# Patient Record
Sex: Female | Born: 1940
Health system: Southern US, Community
[De-identification: ages and names within clinical notes are randomized; demographics above are authoritative.]

## PROBLEM LIST (undated history)

## (undated) DIAGNOSIS — L718 Other rosacea: Secondary | ICD-10-CM

## (undated) DIAGNOSIS — M5412 Radiculopathy, cervical region: Secondary | ICD-10-CM

## (undated) DIAGNOSIS — M159 Polyosteoarthritis, unspecified: Secondary | ICD-10-CM

## (undated) DIAGNOSIS — F419 Anxiety disorder, unspecified: Secondary | ICD-10-CM

## (undated) DIAGNOSIS — I8390 Asymptomatic varicose veins of unspecified lower extremity: Secondary | ICD-10-CM

## (undated) DIAGNOSIS — M5030 Other cervical disc degeneration, unspecified cervical region: Secondary | ICD-10-CM

## (undated) HISTORY — PX: APPENDECTOMY: SHX54

## (undated) HISTORY — PX: BREAST SURGERY: SHX581

## (undated) HISTORY — PX: SHOULDER ARTHROSCOPY DISTAL CLAVICLE EXCISION AND OPEN ROTATOR CUFF REPAIR: SHX2396

## (undated) HISTORY — PX: TONSILLECTOMY: SUR1361

## (undated) HISTORY — DX: Other rosacea: L71.8

## (undated) HISTORY — DX: Asymptomatic varicose veins of unspecified lower extremity: I83.90

## (undated) HISTORY — DX: Other cervical disc degeneration, unspecified cervical region: M50.30

## (undated) HISTORY — PX: CHOLECYSTECTOMY: SHX55

## (undated) HISTORY — PX: KNEE ARTHROPLASTY: SHX992

## (undated) HISTORY — DX: Polyosteoarthritis, unspecified: M15.9

## (undated) HISTORY — DX: Radiculopathy, cervical region: M54.12

---

## 1995-03-06 HISTORY — PX: BACK SURGERY: SHX140

## 2002-02-10 ENCOUNTER — Encounter: Admission: RE | Admit: 2002-02-10 | Discharge: 2002-03-04 | Payer: Self-pay | Admitting: Sports Medicine

## 2003-06-21 ENCOUNTER — Inpatient Hospital Stay (HOSPITAL_COMMUNITY): Admission: RE | Admit: 2003-06-21 | Discharge: 2003-06-24 | Payer: Self-pay | Admitting: Orthopedic Surgery

## 2003-08-05 ENCOUNTER — Encounter: Admission: RE | Admit: 2003-08-05 | Discharge: 2003-10-12 | Payer: Self-pay | Admitting: Orthopedic Surgery

## 2006-12-18 ENCOUNTER — Encounter: Admission: RE | Admit: 2006-12-18 | Discharge: 2007-02-05 | Payer: Self-pay | Admitting: Orthopedic Surgery

## 2007-04-16 ENCOUNTER — Encounter (INDEPENDENT_AMBULATORY_CARE_PROVIDER_SITE_OTHER): Payer: Self-pay | Admitting: Orthopedic Surgery

## 2007-04-16 ENCOUNTER — Ambulatory Visit (HOSPITAL_COMMUNITY): Admission: RE | Admit: 2007-04-16 | Discharge: 2007-04-17 | Payer: Self-pay | Admitting: Orthopedic Surgery

## 2010-07-18 NOTE — Op Note (Signed)
[name], [name]             ACCOUNT NO.:  [id number]   MEDICAL RECORD NO.:  [id number]          PATIENT TYPE:  OIB   LOCATION:  1525                         FACILITY:  [facility]   PHYSICIAN:  John L. Rendall, M.D.  DATE OF BIRTH:  March 12, 1940   DATE OF PROCEDURE:  04/16/2007  DATE OF DISCHARGE:                               OPERATIVE REPORT   PREOPERATIVE DIAGNOSIS:  Quadriceps avulsion from right patella of total  knee replacement.   SURGICAL PROCEDURES:  Reconstruction of patella, right knee, with repair  of quadriceps tendon.   POSTOPERATIVE DIAGNOSIS:  Quadriceps avulsion from right patella of  total knee replacement.   SURGEON:  John L. Rendall, M.D.   ASSISTANTArlys John D. Petrarca, P.A.-C.   ANESTHESIA:  Spinal.   PATHOLOGY:  The patient is approximately five years from total knee  replacement on the right and has developed progressive increasing pain  in the right knee.  She has had recurrent hemorrhagic effusions and she  has a quad extension lag which has developed the last several months.  MRI shows quad tendon avulsion from the superior pole of the patella.   PROCEDURE:  Under spinal anesthesia, the right leg is prepared with  DuraPrep and draped as a sterile field, was wrapped out with the Esmarch  and the tourniquet is used to 350 mm.  Previous surgical skin incision  is excised.  Dissection was carried down through 1-1/2 inch fatty layer  to the quadriceps mechanism and patella.  This is carefully exposed  before incision.  There is felt to be some thinning of the quadriceps  just proximal to the patella but there is no actual palpable defect  externally.  A medial parapatellar deep incision is then made and  indeed, there is avulsion of quad tendon from the superior pole of the  patella.  The patient's LCS patellar component is overhanging and is at  the level of the upper edge of the bone.  To be able to sew the quad  back into bone, it was necessary to  remove the patella.  Once this was  done and the bony surface was freshened, it was felt to be too thin and  friable to punch more drill holes through for another component.  It was  then rasped as smooth as possible.  Several drill holes were placed and  #2 FiberWire was placed into the patella.  The upper edge of the patella  was freshened to give [place] good of bleeding bone surface as possible.  The  quality of the bone there was not the best but it was what we had.  The  quadriceps tendon was then evaluated.  The inferior 5 mm of it was full  of scar tissue.  This was excised to get back to good collagen and a  modified Tajima stitch was used from the patella bone tunnels into the  quad tendon to reattach it to the superior patella.  In this manner two  stitches were placed in the superior portion of the patella.  I was  reluctant to release the most lateral portion of  the quadriceps and  tightened that down as there appeared to be potential worry in my mind  about avascular necrosis of the patella if I took down much more of the  blood supply to the patella.  Once the major stitches were in place,  there did appear good continuity.  The quad mechanism was then repaired  with mattress sutures of #1 Tycron, the medial parapatellar repair was  repaired with #1 Tycron and the subcu with #1 Vicryl and 2-0 Vicryl.  It  should be noted that upon entering the joint to look at the prosthesis,  there was evidence of hemosiderin staining.  A synovectomy of this  hemosiderin stained synovium was resected and sent the lab.  Subtotal  synovectomy having been carried out, femoral component was solidly in  place.  The tibial component appeared to have an excellent bearing there  was no issues about the tibial component and upon the wound closure, the  patient was placed in a knee immobilizer.  The wound was infiltrated  with Marcaine 0.25% with  epinephrine and 4 mg of morphine 20 mL total.  She  returned to recovery  and was released to home when her spinal wore off.  She will be given  meds that include Celebrex, Lyrica and OxyContin and return to the  office at the first of next week.      John L. Rendall, M.D.  Electronically Signed     JLR/MEDQ  D:  04/16/2007  T:  04/17/2007  Job:  [id number]

## 2010-07-21 NOTE — Op Note (Signed)
NAMEJACKILYN, [name]                         ACCOUNT NO.:  [id number]   MEDICAL RECORD NO.:  [id number]                   PATIENT TYPE:  INP   LOCATION:  2550                                 FACILITY:  [facility]   PHYSICIAN:  John L. Rendall III, M.D.           DATE OF BIRTH:  05/20/1940   DATE OF PROCEDURE:  06/21/2003  DATE OF DISCHARGE:                                 OPERATIVE REPORT   PREOPERATIVE DIAGNOSIS:  Osteoarthritis, right knee.   POSTOPERATIVE DIAGNOSIS:  Osteoarthritis, right knee.   OPERATION PERFORMED:  Right LCS total knee replacement.   SURGEON:  John L. Priscille Kluver, M.D.   ASSISTANT:  Jamelle Rushing, P.A.   ANESTHESIA:  General.   PATHOLOGY:  Bone-against-bone medial compartment right knee with advanced  arthritis the remainder of the knee as well.   DESCRIPTION OF PROCEDURE:  Under general anesthesia, the right leg was  prepared with DuraPrep and draped as a sterile field.  A sterile tourniquet  was placed proximally, the leg was wrapped out with the Esmarch and the  tourniquet was used at 350 mmHg.  A midline incision was made, carried  medial parapatellar deep.  The patella was everted, the femur was sized at a  medium.  Using the first tibial guide, a proximal tibial resection was  carried out.  Using the first femoral guide, the intercondylar drill hole  was placed.  Using the second femoral guide, the anterior and posterior  flare of the femoral condyles were removed with a 12.5 flexion gap balanced  by the release of a medial tibial plateau of the medial fascial sleeve.  The  intramedullary guide was then used and the distal femoral cut was made for a  balanced 12.5 extension gap.  Recessing guide was then used.  Lamina  spreader was then inserted.  Remnants of the menisci and cruciates were  resected.  The proximal tibial was then exposed.  It was sized at a 2.5.  Center peg hole with keel was placed.  Trial reduction then of a 2.5 tibia.,  12.5 bearing  and medium femur reveals excellent fit, alignment and stability  with the capsule closed with towel clips.  The patella was then  osteotomized.  Three peg holes placed and stability was then tested with a  patellar component  in place.  At this point permanent components were  obtained while the bony surfaces were prepared with pressure irrigation,  some drill holes were placed in some of the firmer more ivory bone on the  tibial plateau posteromedially and posterolaterally.  Once this was  complete, permanent components were cemented in place, tibia femur and  patella.  Once cement was hardened, excess was removed.  Medium Hemovac  drain was inserted.  Tourniquet was let down at 50 minutes.  Multiple small  vessels were cauterized.  The knee was then closed in layers with #1 Tycron,  #1 Vicryl, 2-0 Vicryl and skin  clips.  Operative time just a little over one  hour.  The patient tolerated the procedure well and was transported to  recovery in good condition.                                               John L. Dorothyann Gibbs, M.D.    Renato Gails  D:  06/21/2003  T:  06/21/2003  Job:  [id number]

## 2010-07-21 NOTE — Discharge Summary (Signed)
[name], [name]                         ACCOUNT NO.:  [id number]   MEDICAL RECORD NO.:  [id number]                   PATIENT TYPE:  INP   LOCATION:  5008                                 FACILITY:  [facility]   PHYSICIAN:  Legrand Pitts. Duffy, P.A.                DATE OF BIRTH:  [birth year]   DATE OF ADMISSION:  06/21/2003  DATE OF DISCHARGE:  06/24/2003                                 DISCHARGE SUMMARY   ADMISSION DIAGNOSES:  1. Osteoarthritis, right knee.  2. Left heel plantar fasciitis.  3. Memory loss, treated with Aricept.  4. Seasonal allergies/sinusitis.  5. History of shoulder impingement bilaterally.   DISCHARGE DIAGNOSES:  1. Osteoarthritis, right knee, status post right total-knee arthroplasty.  2. Acute blood-loss anemia secondary to surgery.  3. Nausea secondary to medications.  4. Constipation.  5. Left heel plantar fasciitis.  6. Memory loss, treated with Aricept.  7. Seasonal allergies/sinusitis.  8. History of bilateral shoulder impingement.   SURGICAL PROCEDURE:  On June 21, 2003, [name] underwent a right total-  knee arthroplasty by Jonny Ruiz L. Rendall, M.D., assisted by Jamelle Rushing, P.A.-  C.  She had a DePuy LCS complete primary femoral component cemented, size  medium, right with a DePuy MBT keel tray cemented size 2.5.  An LCS complete  RP insert size 12.5-mm Signa and an LCS three-peg rotating patellar cemented  size medium.   COMPLICATIONS:  None.   CONSULTATIONS:  1. Physical therapy case management consult, June 22, 2003.  2. Occupational therapy consult, June 22, 2003.   HISTORY OF PRESENT ILLNESS:  A 70 year old female presents to Dr. Marciano Sequin  with right knee pain for the last five years.  The pain is pretty much  constant prior to her most recent cortisone injection.  It is worse with  ambulation and cortisone has provided the most relief.  She has had multiple  arthroscopic procedures and has otherwise failed conservative treatment.  Because of that, she is presenting for right knee replacement.   HOSPITAL COURSE:  [name] tolerated surgical procedure well without  immediate postoperative complications.  She was subsequently transferred to  5000.  On postop day #1, she had some nausea and that was treated  effectively with medications.  Hemoglobin was 10.8, hematocrit 30.6.  Her  leg was neurovascularly intact.  She was continued on IV pain medications at  that time due to the nausea and started on therapy per protocol.  On postop  day #2, nausea had improved.  She was switched to p.o. pain medications.  She was afebrile, vital signs table, right knee incision was benign.  Hemoglobin was stable at 10.2, hematocrit 29.5 and she was continued with  therapy.  She did well that day and on April 21, was ready for discharge  home.  She was discharged home later that day.   DISCHARGE INSTRUCTIONS:   DIET:  She can resume her regular  pre-hospitalization diet.   MEDICATIONS:  She may resume her regular pre-hospitalization medications  except no Darvocet while on other pain medications.   HOME MEDICATIONS:  1. Aricept 5 p.o. daily.  2. Multivitamin p.o. daily.  3. Chondroitin and glucosamine two tablets p.o. daily.  4. Allegra p.r.n.   ADDITIONAL MEDICATIONS:  1. Arixtra 2.5 mg subcu daily, last dose June 28, 2003.  Then she is to     start one baby aspirin a day for one month.  2. OxyContin 10 mg one tablet p.o. q.12h. until gone.  3. Oxy-IR 5 mg, 1-2 tablets p.o. q.4-6h. p.r.n. pain as needed.   ACTIVITY:  She can be out of bed, weight-bearing as tolerated on the right  leg with the use of a walker.  She has arranged for home health PT per  [facility], and a home CPM 0-90 degrees, 6-8 hours a day.  Please see the [facility] discharge sheet for further activity  instructions.   WOUND CARE:  She is to keep the right knee incision clean and dry and make  sure no drainage from the wound for two  days.  Please see the [facility]  knee discharge sheet for further wound care instructions.   FOLLOW UP:  She needs to follow up with Dr. Marciano Sequin in our office in  approximately 10-12 days and needs to call [phone] for that appointment.   LABORATORY DATA:  On April 19, hemoglobin 10.8, hematocrit 30.6.  On April 20, hemoglobin 10.2, hematocrit 29.5.  On April 21, white count 9.8, hemoglobin 10.4, hematocrit 29.9 and platelet  count 195,000.  On April 19, glucose 120, calcium 8.3.  On April 20, sodium was 132, potassium 3.9, glucose 119, BUN 5, creatinine  0.7 and calcium 7.9.  On April 15, urinalysis showed small amount leukocyte esterase, few  epithelial cells, 3-6 white cells, no red cells, rare bacteria and calcium  oxalate crystals.                                                Legrand Pitts Duffy, P.A.   KED/MEDQ  D:  08/13/2003  T:  08/14/2003  Job:  [id number]   cc:   Delaney Meigs, M.D.  [address]  [place]  [place] [address]  Fax: [phone]

## 2010-07-21 NOTE — H&P (Signed)
NAMEHILLARY, [name]                         ACCOUNT NO.:  [id number]   MEDICAL RECORD NO.:  [id number]                   PATIENT TYPE:  INP   LOCATION:                                       FACILITY:  [facility]   PHYSICIAN:  John L. Rendall III, M.D.           DATE OF BIRTH:  Oct 05, 1940   DATE OF ADMISSION:  06/21/2003  DATE OF DISCHARGE:                                HISTORY & PHYSICAL   CHIEF COMPLAINT:  Right knee pain.   HISTORY OF PRESENT ILLNESS:  [name] is a very-pleasant 70 year old  female with right knee pain x5 years.  The patient recently had a cortisone  injection and presently has very little to no pain.  However, prior to the  injection of June 06, 2003, the patient was in constant pain.  She states  that the pain was made worse with ambulation.  The patient describes no  mechanical symptoms.  She has had multiple cortisone injections and  apparently takes glucosamine and chondroitin along with Darvocet for her  pain.  Her biggest complaint is night pain.  She states with the  glucosamine, chondroitin and Darvocet, she is able to tolerate the day pain.  However, the patient does state that she is tired of living on pills.  The  patient did undergo a right knee arthroscopy for degenerative medial  meniscus plus medial compartment patellofemoral arthritis, in December of  2001.  X-rays of the right knee show joint space narrowing in the medial  compartment in the patellofemoral compartment.  Since the patient has failed  conservative treatment, she will be admitted to [facility] on June 21, 2003, and undergo a right total knee arthroplasty.   ALLERGIES:  CODEINE CAUSES GI UPSET.   MEDICATIONS:  1. Aricept 5 mg one daily.  2. Darvocet N 100 p.r.n.  3. Multivitamin daily.  4. Glucosamine chondroitin two tablets daily.  5. Allegra p.r.n.   PAST MEDICAL HISTORY:  1. Osteoarthritis right knee.  2. Plantar fasciitis, left heel.  3. Recent history of  bilateral shoulder impingement.  4. Otherwise, the patient denies any diabetes, hypertension, thyroid, GI,     GU, or neurologic medical problems.   PAST SURGICAL HISTORY:  1. Cholecystectomy.  2. Left-shoulder rotator cuff repair.  3. Right knee arthroscopy.  4. Breast implant 1970's and removal of breast implants, 1980's.  5. The patient states that she required no blood products with any of the     above procedures and had no complications with the anesthesia with the     above.   SOCIAL HISTORY:  The patient denies any tobacco use.  She drinks socially.  She is married.  She has three adult children.   PRIMARY CARE PHYSICIAN:  Dr. Alberteen Spindle, in [place], phone number  [phone].   The patient lives in a one-story home with two steps to usual entrance.   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   FAMILY HISTORY:  The patient's mother is deceased at age 42 due to a  neurologic condition that the patient states was similar to  [employer]  disease.  Father is deceased at age 20 due to a heart attack.  No other  known health problems.  Otherwise, review of family history revealed  positive for glaucoma, hypertension and diabetes mellitus.   REVIEW OF SYSTEMS:  The patient denies any recent cold, fever or flu-like  symptoms.  She does have occasional bouts of allergies and sinusitis.  She  denies any chest pain, shortness of breath, PND, or orthopnea.  She wears  glasses for reading.  Otherwise, denies GI, GU or neurologic medical  problems.  She did have a recent abnormal mammogram and underwent a biopsy  June 14, 2003, and the results are pending at this time.   PHYSICAL EXAMINATION:  GENERAL:  The patient is a well-developed, well-  nourished, overweight female who walks with a limp and antalgic gait.  The  patient's mood and affect are appropriate.  She talks easily with examiner.  VITAL SIGNS:  Height 5 feet, 1-3/4 inch.   Weight 220 pounds.  Temperature  96.2 degrees Fahrenheit.  Blood pressure 150/90, pulse 64, respiratory rate  16.  CARDIAC:  Regular rate and rhythm, no No murmurs rubs, or gallops noted.  RESPIRATORY:  Lungs are clear to auscultation bilaterally.  ABDOMEN:  Soft, obese, nontender.  Positive bowel sounds x4 quadrants.  BACK:  Nontender to palpation over the thoracic and lumbar spine.  BREASTS/GU/RECTAL:  Exams are all deferred at this time.  HEENT:  Normocephalic, atraumatic without frontal or maxillary sinus  tenderness.  Conjunctivae are pink bilaterally.  Sclerae is nonicteric.  PERRLA.  EOM's are intact.  Congenital ear deformities are noted.  TM's are  pearly gray bilaterally.  Nose and nasal septum are midline.  Nasal mucosa  is pink, moist and without polyps.  Buccal mucosa is pink and moist.  Good  dentition.  Pharynx is without exudate.  There is some mild erythema noted.  Postnasal drip also noted.  Tongue and uvula are midline.  MUSCULOSKELETAL:  Upper extremities are equal and symmetric in size and  shape.  She has full range of motion of both upper extremities.  Bilateral  shoulders with crepitance but no pain.  EXTREMITIES:  Deep tendon reflexes in the upper extremity are equal and  symmetric bilaterally.  Radial pulses are 2+ bilaterally.  Lower extremities are equal and symmetric  in size and shape.  Bilateral hips are full range of motion.  Manipulation  causes no pain.  Bilateral knees are 0-120 degrees bilaterally.  Right knee  does have approximately five degree varus deformity.  Palpation reveals  tenderness in the medial compartment.  The forced flexion of the right knee  causes some pain.  There are three well-healed arthroscopic portal sites  noted, and they are well healed.  Passive range of motion reveals mild  crepitance in the right knee.  Valgus varus stressing reveals no laxity. Knee is without edema, erythema or effusion.  The left knee valgus varus   stressing causes no pain and reveals no laxity.  The knee is without  effusion, edema or ecchymosis.  Lower extremities, bilateral symmetric  edema, most notably in the ankles; however, this is not pitting edema.  Dorsal pedal pulses are 2+ bilaterally.  The patient has 5/5 strength with  plantar and dorsiflexion  against resistance.  NEUROLOGIC:  The patient is alert and oriented x3.  Cranial nerves II-XII  are grossly intact.  Strength testing of the upper and lower extremities  against resistance reveals 5/5 strength throughout.  Deep tendon reflexes  are equal and symmetric bilaterally in the upper and lower extremities.   IMPRESSION:  1. Osteoarthritis right knee.  2. Left heel plantar fasciitis.  3. Recent history of bilateral shoulder impingement.  4. Memory loss of Aricept.  5. Seasonal allergies/ sinusitis.   PLAN:  1. The patient is to be admitted to [facility] on June 21, 2003,     and will undergo a right total knee replacement by Dr. Jonny Ruiz L. Rendall.  2. The patient is to undergo all preoperative labs and exams prior to     surgery.      Richardean Canal, P.A.                       John L. Dorothyann Gibbs, M.D.    GC/MEDQ  D:  06/15/2003  T:  06/15/2003  Job:  [id number]

## 2010-09-15 ENCOUNTER — Emergency Department (HOSPITAL_COMMUNITY): Payer: Medicare Other

## 2010-09-15 ENCOUNTER — Emergency Department (HOSPITAL_COMMUNITY)
Admission: EM | Admit: 2010-09-15 | Discharge: 2010-09-15 | Disposition: A | Discharge status: Home / Self Care | Payer: Medicare Other | Attending: Emergency Medicine | Admitting: Emergency Medicine

## 2010-09-15 DIAGNOSIS — S93409A Sprain of unspecified ligament of unspecified ankle, initial encounter: Secondary | ICD-10-CM | POA: Insufficient documentation

## 2010-09-15 DIAGNOSIS — F3289 Other specified depressive episodes: Secondary | ICD-10-CM | POA: Insufficient documentation

## 2010-09-15 DIAGNOSIS — I10 Essential (primary) hypertension: Secondary | ICD-10-CM | POA: Insufficient documentation

## 2010-09-15 DIAGNOSIS — M25579 Pain in unspecified ankle and joints of unspecified foot: Secondary | ICD-10-CM | POA: Insufficient documentation

## 2010-09-15 DIAGNOSIS — F329 Major depressive disorder, single episode, unspecified: Secondary | ICD-10-CM | POA: Insufficient documentation

## 2010-09-15 DIAGNOSIS — X500XXA Overexertion from strenuous movement or load, initial encounter: Secondary | ICD-10-CM | POA: Insufficient documentation

## 2010-11-24 LAB — PROTIME-INR
INR: 1
Prothrombin Time: 13.6

## 2010-11-24 LAB — CBC
Hemoglobin: 12.9
MCHC: 34.8
MCV: 95.6
RBC: 3.88
WBC: 5.7

## 2010-11-24 LAB — URINALYSIS, ROUTINE W REFLEX MICROSCOPIC
Bilirubin Urine: NEGATIVE
Glucose, UA: NEGATIVE
Hgb urine dipstick: NEGATIVE
Protein, ur: NEGATIVE
Urobilinogen, UA: 0.2

## 2010-11-24 LAB — COMPREHENSIVE METABOLIC PANEL
ALT: 17
AST: 19
CO2: 27
Calcium: 9.5
Chloride: 108
GFR calc Af Amer: >60
GFR calc non Af Amer: >60
Glucose, Bld: 104 — ABNORMAL HIGH
Sodium: 144
Total Bilirubin: 0.9

## 2010-11-24 LAB — DIFFERENTIAL
Basophils Absolute: 0
Basophils Relative: 0
Eosinophils Absolute: 0.2
Eosinophils Relative: 3
Neutrophils Relative %: 51

## 2010-11-24 LAB — URINE MICROSCOPIC-ADD ON

## 2011-04-04 DIAGNOSIS — R079 Chest pain, unspecified: Secondary | ICD-10-CM | POA: Insufficient documentation

## 2011-09-17 ENCOUNTER — Encounter: Payer: Self-pay | Admitting: Physician Assistant

## 2012-01-10 ENCOUNTER — Encounter: Payer: Self-pay | Admitting: Physician Assistant

## 2012-07-16 ENCOUNTER — Other Ambulatory Visit (HOSPITAL_COMMUNITY): Payer: Self-pay | Admitting: Orthopedic Surgery

## 2012-07-16 DIAGNOSIS — M25561 Pain in right knee: Secondary | ICD-10-CM

## 2012-08-25 ENCOUNTER — Encounter (HOSPITAL_COMMUNITY)
Admission: RE | Admit: 2012-08-25 | Discharge: 2012-08-25 | Disposition: A | Discharge status: Home / Self Care | Payer: Medicare Other | Source: Ambulatory Visit | Attending: Orthopedic Surgery | Admitting: Orthopedic Surgery

## 2012-08-25 DIAGNOSIS — M25561 Pain in right knee: Secondary | ICD-10-CM

## 2012-08-25 DIAGNOSIS — M25569 Pain in unspecified knee: Secondary | ICD-10-CM | POA: Insufficient documentation

## 2012-08-25 MED ORDER — TECHNETIUM TC 99M MEDRONATE IV KIT
25.0000 | PACK | Freq: Once | INTRAVENOUS | Status: AC | PRN
  Administered 2012-08-25: 25 via INTRAVENOUS

## 2012-08-28 DIAGNOSIS — M25569 Pain in unspecified knee: Secondary | ICD-10-CM | POA: Insufficient documentation

## 2012-10-31 DIAGNOSIS — M1712 Unilateral primary osteoarthritis, left knee: Secondary | ICD-10-CM | POA: Insufficient documentation

## 2013-07-02 DIAGNOSIS — I10 Essential (primary) hypertension: Secondary | ICD-10-CM | POA: Insufficient documentation

## 2014-04-02 ENCOUNTER — Other Ambulatory Visit: Payer: Self-pay | Admitting: *Deleted

## 2014-04-02 ENCOUNTER — Encounter: Payer: Self-pay | Admitting: Vascular Surgery

## 2014-04-02 DIAGNOSIS — I83813 Varicose veins of bilateral lower extremities with pain: Secondary | ICD-10-CM

## 2014-05-19 ENCOUNTER — Encounter: Payer: Self-pay | Admitting: Vascular Surgery

## 2014-05-19 ENCOUNTER — Encounter (HOSPITAL_COMMUNITY): Payer: Self-pay

## 2014-09-03 DIAGNOSIS — S99922A Unspecified injury of left foot, initial encounter: Secondary | ICD-10-CM

## 2014-09-03 DIAGNOSIS — S99912A Unspecified injury of left ankle, initial encounter: Secondary | ICD-10-CM

## 2014-09-03 DIAGNOSIS — M25572 Pain in left ankle and joints of left foot: Secondary | ICD-10-CM | POA: Insufficient documentation

## 2014-09-03 DIAGNOSIS — M1712 Unilateral primary osteoarthritis, left knee: Secondary | ICD-10-CM | POA: Insufficient documentation

## 2014-09-03 DIAGNOSIS — S8992XA Unspecified injury of left lower leg, initial encounter: Secondary | ICD-10-CM | POA: Insufficient documentation

## 2015-05-23 ENCOUNTER — Other Ambulatory Visit: Payer: Self-pay | Admitting: Gastroenterology

## 2015-09-02 ENCOUNTER — Encounter: Payer: Self-pay | Admitting: Physician Assistant

## 2015-09-09 ENCOUNTER — Other Ambulatory Visit: Payer: Self-pay | Admitting: Orthopedic Surgery

## 2015-10-06 ENCOUNTER — Encounter (HOSPITAL_COMMUNITY): Payer: Self-pay

## 2015-10-06 ENCOUNTER — Other Ambulatory Visit (HOSPITAL_COMMUNITY): Payer: Self-pay | Admitting: *Deleted

## 2015-10-06 NOTE — Pre-Procedure Instructions (Addendum)
[place]  10/06/2015      [facility] - [place], [address] [place] [address] Phone: [phone] Fax: [phone]    Your procedure is scheduled on Monday, October 17, 2015    Report to [facility] Entrance "A" Admitting Office at 5:30 AM.   Call this number if you have problems the morning of surgery: [phone]   Any questions prior to day of surgery, please call [phone] between 8 & 4 PM.   Remember:  Do not eat food or drink liquids after midnight Sunday, 10/16/15.  Take these medicines the morning of surgery with A SIP OF WATER: Citalopram (Celexa), Valacyclovir (Valtrex), Hydrocodone - if needed.  Stop Aspirin, NSAIDS (Ibuprofen, Aleve, etc.) glucosamine-chondroitin,naproxen, and Herbal medications 7 days prior to surgery.(10/10/15)   Do not wear jewelry, make-up or nail polish.  Do not wear lotions, powders, or perfumes.  You may wear deodorant.  Do not shave 48 hours prior to surgery.    Do not bring valuables to the hospital.  [facility] is not responsible for any belongings or valuables.  Contacts, dentures or bridgework may not be worn into surgery.  Leave your suitcase in the car.  After surgery it may be brought to your room.  For patients admitted to the hospital, discharge time will be determined by your treatment team.  Special instructions:  [place] - Preparing for Surgery  Before surgery, you can play an important role.  Because skin is not sterile, your skin needs to be as free of germs as possible.  You can reduce the number of germs on you skin by washing with CHG (chlorahexidine gluconate) soap before surgery.  CHG is an antiseptic cleaner which kills germs and bonds with the skin to continue killing germs even after washing.  Please DO NOT use if you have an allergy to CHG or antibacterial soaps.  If your skin becomes reddened/irritated stop using the CHG and inform your nurse when you  arrive at Short Stay.  Do not shave (including legs and underarms) for at least 48 hours prior to the first CHG shower.  You may shave your face.  Please follow these instructions carefully:   1.  Shower with CHG Soap the night before surgery and the                                morning of Surgery.  2.  If you choose to wash your hair, wash your hair first as usual with your       normal shampoo.  3.  After you shampoo, rinse your hair and body thoroughly to remove the shampoo.  4.  Use CHG as you would any other liquid soap.  You can apply chg directly       to the skin and wash gently with scrungie or a clean washcloth.  5.  Apply the CHG Soap to your body ONLY FROM THE NECK DOWN.        Do not use on open wounds or open sores.  Avoid contact with your eyes, ears, mouth and genitals (private parts).  Wash genitals (private parts) with your normal soap.  6.  Wash thoroughly, paying special attention to the area where your surgery        will be performed.  7.  Thoroughly rinse your body with warm water from the neck down.  8.  DO NOT shower/wash with your normal soap after using and rinsing off       the CHG Soap.  9.  Pat yourself dry with a clean towel.            10.  Wear clean pajamas.            11.  Place clean sheets on your bed the night of your first shower and do not        sleep with pets.  Day of Surgery  Do not apply any lotions the morning of surgery.  Please wear clean clothes to the hospital.   Please read over the following fact sheets that you were given. Pain Booklet, Coughing and Deep Breathing, MRSA Information and Surgical Site Infection Prevention

## 2015-10-07 ENCOUNTER — Encounter (HOSPITAL_COMMUNITY): Payer: Self-pay

## 2015-10-07 ENCOUNTER — Encounter (HOSPITAL_COMMUNITY)
Admission: RE | Admit: 2015-10-07 | Discharge: 2015-10-07 | Disposition: A | Discharge status: Home / Self Care | Payer: Medicare Other | Source: Ambulatory Visit | Attending: Orthopedic Surgery | Admitting: Orthopedic Surgery

## 2015-10-07 ENCOUNTER — Ambulatory Visit (HOSPITAL_COMMUNITY)
Admission: RE | Admit: 2015-10-07 | Discharge: 2015-10-07 | Disposition: A | Discharge status: Home / Self Care | Payer: Medicare Other | Source: Ambulatory Visit | Attending: Orthopedic Surgery | Admitting: Orthopedic Surgery

## 2015-10-07 DIAGNOSIS — R9431 Abnormal electrocardiogram [ECG] [EKG]: Secondary | ICD-10-CM | POA: Diagnosis not present

## 2015-10-07 DIAGNOSIS — Z01818 Encounter for other preprocedural examination: Secondary | ICD-10-CM | POA: Diagnosis not present

## 2015-10-07 DIAGNOSIS — Z01810 Encounter for preprocedural cardiovascular examination: Secondary | ICD-10-CM | POA: Diagnosis not present

## 2015-10-07 DIAGNOSIS — Z01812 Encounter for preprocedural laboratory examination: Secondary | ICD-10-CM | POA: Insufficient documentation

## 2015-10-07 HISTORY — DX: Anxiety disorder, unspecified: F41.9

## 2015-10-07 LAB — COMPREHENSIVE METABOLIC PANEL
ALT: 20 U/L (ref 14–54)
ANION GAP: 9 (ref 5–15)
AST: 24 U/L (ref 15–41)
Albumin: 4.3 g/dL (ref 3.5–5.0)
Alkaline Phosphatase: 67 U/L (ref 38–126)
BUN: 20 mg/dL (ref 6–20)
CALCIUM: 9.5 mg/dL (ref 8.9–10.3)
CHLORIDE: 104 mmol/L (ref 101–111)
CO2: 27 mmol/L (ref 22–32)
Creatinine, Ser: 0.77 mg/dL (ref 0.44–1.00)
Glucose, Bld: 109 mg/dL — ABNORMAL HIGH (ref 65–99)
Potassium: 3.8 mmol/L (ref 3.5–5.1)
SODIUM: 140 mmol/L (ref 135–145)
Total Bilirubin: 0.8 mg/dL (ref 0.3–1.2)
Total Protein: 7.3 g/dL (ref 6.5–8.1)

## 2015-10-07 LAB — CBC WITH DIFFERENTIAL/PLATELET
Basophils Absolute: 0 10*3/uL (ref 0.0–0.1)
Basophils Relative: 0 %
EOS ABS: 0.1 10*3/uL (ref 0.0–0.7)
EOS PCT: 1 %
HCT: 42.2 % (ref 36.0–46.0)
Hemoglobin: 14.2 g/dL (ref 12.0–15.0)
LYMPHS ABS: 2.1 10*3/uL (ref 0.7–4.0)
Lymphocytes Relative: 37 %
MCH: 32.5 pg (ref 26.0–34.0)
MCHC: 33.6 g/dL (ref 30.0–36.0)
MCV: 96.6 fL (ref 78.0–100.0)
MONO ABS: 0.7 10*3/uL (ref 0.1–1.0)
MONOS PCT: 12 %
Neutro Abs: 2.8 10*3/uL (ref 1.7–7.7)
Neutrophils Relative %: 50 %
PLATELETS: 225 10*3/uL (ref 150–400)
RBC: 4.37 MIL/uL (ref 3.87–5.11)
RDW: 12.6 % (ref 11.5–15.5)
WBC: 5.6 10*3/uL (ref 4.0–10.5)

## 2015-10-07 LAB — SURGICAL PCR SCREEN
MRSA, PCR: NEGATIVE
STAPHYLOCOCCUS AUREUS: NEGATIVE

## 2015-10-07 LAB — PROTIME-INR
INR: 1.05
PROTHROMBIN TIME: 13.7 s (ref 11.4–15.2)

## 2015-10-07 LAB — URINALYSIS, ROUTINE W REFLEX MICROSCOPIC
Glucose, UA: NEGATIVE mg/dL
HGB URINE DIPSTICK: NEGATIVE
KETONES UR: 15 mg/dL — AB
LEUKOCYTES UA: NEGATIVE
Nitrite: NEGATIVE
PROTEIN: NEGATIVE mg/dL
Specific Gravity, Urine: 1.034 — ABNORMAL HIGH (ref 1.005–1.030)
pH: 5 (ref 5.0–8.0)

## 2015-10-07 NOTE — Progress Notes (Signed)
Office called re: ted hose. None available for calf size

## 2015-10-09 LAB — URINE CULTURE

## 2015-10-11 NOTE — Progress Notes (Signed)
Anesthesia Chart Review:  Pt is a 75 year old female scheduled for L total knee arthroplasty on 10/17/2015 with [name], MD.   PMH includes:  Osteoarthritis. Never smoker. BMI 42.5  Medications include: ASA, aricept, lasix  Preoperative labs reviewed.    Chest x-ray 10/07/15 reviewed.  1. No acute abnormality. 2. Minimal chronic bronchitic changes.  EKG 10/07/15: NSR. Septal infarct, age undetermined. Appears stable when compared to EKG 04/16/07.   Nuclear stress test 07/13/13 ([facility]): Normal stress myocardial perfusion images. EF 62%. Global LV systolic function normal. Low risk scan.    If no changes, I anticipate pt can proceed with surgery as scheduled.   Willeen Cass, FNP-BC [facility]/Anesthesiology Phone: [phone] 10/11/2015 4:21 PM

## 2015-10-14 MED ORDER — SODIUM CHLORIDE 0.9 % IV SOLN: INTRAVENOUS | Status: DC

## 2015-10-14 MED ORDER — BUPIVACAINE LIPOSOME 1.3 % IJ SUSP
20.0000 mL | INTRAMUSCULAR | Status: AC
  Administered 2015-10-17: 20 mL
  Filled 2015-10-14: qty 20

## 2015-10-14 MED ORDER — CLINDAMYCIN PHOSPHATE 900 MG/50ML IV SOLN
900.0000 mg | INTRAVENOUS | Status: AC
  Administered 2015-10-17: 900 mg via INTRAVENOUS
  Filled 2015-10-14: qty 50

## 2015-10-14 MED ORDER — TRANEXAMIC ACID 1000 MG/10ML IV SOLN
1000.0000 mg | INTRAVENOUS | Status: AC
  Administered 2015-10-17: 1000 mg via INTRAVENOUS
  Filled 2015-10-14: qty 10

## 2015-10-14 MED ORDER — ACETAMINOPHEN 500 MG PO TABS
1000.0000 mg | ORAL_TABLET | Freq: Once | ORAL | Status: AC
  Administered 2015-10-17: 1000 mg via ORAL
  Filled 2015-10-14: qty 2

## 2015-10-16 NOTE — Anesthesia Preprocedure Evaluation (Addendum)
Anesthesia Evaluation  Patient identified by MRN, date of birth, ID band Patient awake    Reviewed: Allergy & Precautions, NPO status , Patient's Chart, lab work & pertinent test results  Airway Mallampati: III  TM Distance: >3 FB Neck ROM: Full    Dental  (+) Dental Advisory Given, Teeth Intact   Pulmonary neg pulmonary ROS,    breath sounds clear to auscultation       Cardiovascular negative cardio ROS   Rhythm:Regular Rate:Normal     Neuro/Psych PSYCHIATRIC DISORDERS Anxiety  Neuromuscular disease    GI/Hepatic negative GI ROS, Neg liver ROS,   Endo/Other  negative endocrine ROS  Renal/GU negative Renal ROS  negative genitourinary   Musculoskeletal  (+) Arthritis , Osteoarthritis,    Abdominal   Peds negative pediatric ROS (+)  Hematology negative hematology ROS (+)   Anesthesia Other Findings Veneers to front teeth  Reproductive/Obstetrics negative OB ROS                          Lab Results  Component Value Date   WBC 5.6 10/07/2015   HGB 14.2 10/07/2015   HCT 42.2 10/07/2015   MCV 96.6 10/07/2015   PLT 225 10/07/2015   Lab Results  Component Value Date   CREATININE 0.77 10/07/2015   BUN 20 10/07/2015   NA 140 10/07/2015   K 3.8 10/07/2015   CL 104 10/07/2015   CO2 27 10/07/2015   Lab Results  Component Value Date   INR 1.05 10/07/2015   INR 1.0 04/16/2007    10/2015 EKG: normal sinus rhythm.   Anesthesia Physical Anesthesia Plan  ASA: III  Anesthesia Plan: Spinal   Post-op Pain Management:    Induction: Intravenous  Airway Management Planned: Natural Airway  Additional Equipment:   Intra-op Plan:   Post-operative Plan:   Informed Consent: I have reviewed the patients History and Physical, chart, labs and discussed the procedure including the risks, benefits and alternatives for the proposed anesthesia with the patient or authorized representative who  has indicated his/her understanding and acceptance.   Dental advisory given  Plan Discussed with: CRNA  Anesthesia Plan Comments:         Anesthesia Quick Evaluation

## 2015-10-17 ENCOUNTER — Encounter (HOSPITAL_COMMUNITY)
Admission: RE | Disposition: A | Discharge status: Home / Self Care | Payer: Self-pay | Source: Ambulatory Visit | Attending: Orthopedic Surgery

## 2015-10-17 ENCOUNTER — Inpatient Hospital Stay (HOSPITAL_COMMUNITY)
Admission: RE | Admit: 2015-10-17 | Discharge: 2015-10-18 | DRG: 470 | Disposition: A | Discharge status: Home / Self Care | Payer: Medicare Other | Source: Ambulatory Visit | Attending: Orthopedic Surgery | Admitting: Orthopedic Surgery

## 2015-10-17 ENCOUNTER — Inpatient Hospital Stay (HOSPITAL_COMMUNITY): Payer: Medicare Other | Admitting: Emergency Medicine

## 2015-10-17 ENCOUNTER — Inpatient Hospital Stay (HOSPITAL_COMMUNITY): Payer: Medicare Other | Admitting: Anesthesiology

## 2015-10-17 ENCOUNTER — Encounter (HOSPITAL_COMMUNITY): Payer: Self-pay | Admitting: General Practice

## 2015-10-17 DIAGNOSIS — M5030 Other cervical disc degeneration, unspecified cervical region: Secondary | ICD-10-CM | POA: Diagnosis present

## 2015-10-17 DIAGNOSIS — F419 Anxiety disorder, unspecified: Secondary | ICD-10-CM | POA: Diagnosis present

## 2015-10-17 DIAGNOSIS — D62 Acute posthemorrhagic anemia: Secondary | ICD-10-CM | POA: Diagnosis not present

## 2015-10-17 DIAGNOSIS — M1712 Unilateral primary osteoarthritis, left knee: Secondary | ICD-10-CM | POA: Diagnosis present

## 2015-10-17 DIAGNOSIS — Z8249 Family history of ischemic heart disease and other diseases of the circulatory system: Secondary | ICD-10-CM | POA: Diagnosis not present

## 2015-10-17 DIAGNOSIS — Z96651 Presence of right artificial knee joint: Secondary | ICD-10-CM

## 2015-10-17 HISTORY — PX: TOTAL KNEE ARTHROPLASTY: SHX125

## 2015-10-17 SURGERY — ARTHROPLASTY, KNEE, TOTAL
Anesthesia: Spinal | Laterality: Left

## 2015-10-17 MED ORDER — DEXTROSE 5 % IV SOLN
500.0000 mg | Freq: Four times a day (QID) | INTRAVENOUS | Status: DC | PRN
  Filled 2015-10-17: qty 5

## 2015-10-17 MED ORDER — FENTANYL CITRATE (PF) 100 MCG/2ML IJ SOLN: 25.0000 ug | INTRAMUSCULAR | Status: DC | PRN

## 2015-10-17 MED ORDER — ONDANSETRON HCL 4 MG/2ML IJ SOLN
INTRAMUSCULAR | Status: AC
  Filled 2015-10-17: qty 2

## 2015-10-17 MED ORDER — DIPHENHYDRAMINE HCL 12.5 MG/5ML PO ELIX: 12.5000 mg | ORAL_SOLUTION | ORAL | Status: DC | PRN

## 2015-10-17 MED ORDER — FLEET ENEMA 7-19 GM/118ML RE ENEM: 1.0000 | ENEMA | Freq: Once | RECTAL | Status: DC | PRN

## 2015-10-17 MED ORDER — DEXAMETHASONE SODIUM PHOSPHATE 10 MG/ML IJ SOLN
10.0000 mg | Freq: Once | INTRAMUSCULAR | Status: AC
  Administered 2015-10-18: 10 mg via INTRAVENOUS
  Filled 2015-10-17: qty 1

## 2015-10-17 MED ORDER — BUPIVACAINE IN DEXTROSE 0.75-8.25 % IT SOLN
INTRATHECAL | Status: DC | PRN
  Administered 2015-10-17: 2 mL via INTRATHECAL

## 2015-10-17 MED ORDER — MIDAZOLAM HCL 2 MG/2ML IJ SOLN
2.0000 mg | Freq: Once | INTRAMUSCULAR | Status: AC
  Administered 2015-10-17: 2 mg via INTRAVENOUS

## 2015-10-17 MED ORDER — LACTATED RINGERS IV SOLN: INTRAVENOUS | Status: DC

## 2015-10-17 MED ORDER — FENTANYL CITRATE (PF) 100 MCG/2ML IJ SOLN
INTRAMUSCULAR | Status: AC
  Filled 2015-10-17: qty 2

## 2015-10-17 MED ORDER — SENNOSIDES-DOCUSATE SODIUM 8.6-50 MG PO TABS: 1.0000 | ORAL_TABLET | Freq: Every evening | ORAL | Status: DC | PRN

## 2015-10-17 MED ORDER — DEXTROSE 5 % IV SOLN
INTRAVENOUS | Status: DC | PRN
  Administered 2015-10-17: 30 ug/min via INTRAVENOUS

## 2015-10-17 MED ORDER — ALUM & MAG HYDROXIDE-SIMETH 200-200-20 MG/5ML PO SUSP: 30.0000 mL | ORAL | Status: DC | PRN

## 2015-10-17 MED ORDER — ACETAMINOPHEN 325 MG PO TABS: 650.0000 mg | ORAL_TABLET | Freq: Four times a day (QID) | ORAL | Status: DC | PRN

## 2015-10-17 MED ORDER — ZOLPIDEM TARTRATE 5 MG PO TABS: 5.0000 mg | ORAL_TABLET | Freq: Every evening | ORAL | Status: DC | PRN

## 2015-10-17 MED ORDER — CLINDAMYCIN PHOSPHATE 600 MG/50ML IV SOLN
600.0000 mg | Freq: Four times a day (QID) | INTRAVENOUS | Status: AC
  Administered 2015-10-17 (×2): 600 mg via INTRAVENOUS
  Filled 2015-10-17 (×2): qty 50

## 2015-10-17 MED ORDER — PROMETHAZINE HCL 25 MG/ML IJ SOLN: 6.2500 mg | INTRAMUSCULAR | Status: DC | PRN

## 2015-10-17 MED ORDER — 0.9 % SODIUM CHLORIDE (POUR BTL) OPTIME
TOPICAL | Status: DC | PRN
  Administered 2015-10-17: 1000 mL

## 2015-10-17 MED ORDER — PROPOFOL 10 MG/ML IV BOLUS
INTRAVENOUS | Status: AC
  Filled 2015-10-17: qty 20

## 2015-10-17 MED ORDER — PHENYLEPHRINE 40 MCG/ML (10ML) SYRINGE FOR IV PUSH (FOR BLOOD PRESSURE SUPPORT)
PREFILLED_SYRINGE | INTRAVENOUS | Status: AC
  Filled 2015-10-17: qty 10

## 2015-10-17 MED ORDER — PROPOFOL 500 MG/50ML IV EMUL
INTRAVENOUS | Status: DC | PRN
  Administered 2015-10-17: 100 ug/kg/min via INTRAVENOUS

## 2015-10-17 MED ORDER — MENTHOL 3 MG MT LOZG: 1.0000 | LOZENGE | OROMUCOSAL | Status: DC | PRN

## 2015-10-17 MED ORDER — BUPIVACAINE-EPINEPHRINE (PF) 0.5% -1:200000 IJ SOLN
INTRAMUSCULAR | Status: DC | PRN
  Administered 2015-10-17: 30 mL via PERINEURAL

## 2015-10-17 MED ORDER — ONDANSETRON HCL 4 MG PO TABS
4.0000 mg | ORAL_TABLET | Freq: Four times a day (QID) | ORAL | Status: DC | PRN
  Filled 2015-10-17: qty 1

## 2015-10-17 MED ORDER — EPHEDRINE SULFATE 50 MG/ML IJ SOLN
INTRAMUSCULAR | Status: AC
  Filled 2015-10-17: qty 1

## 2015-10-17 MED ORDER — MEPERIDINE HCL 25 MG/ML IJ SOLN: 6.2500 mg | INTRAMUSCULAR | Status: DC | PRN

## 2015-10-17 MED ORDER — LACTATED RINGERS IV SOLN
INTRAVENOUS | Status: DC | PRN
  Administered 2015-10-17 (×2): via INTRAVENOUS

## 2015-10-17 MED ORDER — METHOCARBAMOL 500 MG PO TABS
500.0000 mg | ORAL_TABLET | Freq: Four times a day (QID) | ORAL | Status: DC | PRN
  Administered 2015-10-17 – 2015-10-18 (×2): 500 mg via ORAL
  Filled 2015-10-17 (×2): qty 1

## 2015-10-17 MED ORDER — BISACODYL 5 MG PO TBEC: 5.0000 mg | DELAYED_RELEASE_TABLET | Freq: Every day | ORAL | Status: DC | PRN

## 2015-10-17 MED ORDER — SODIUM CHLORIDE 0.9 % IJ SOLN
INTRAMUSCULAR | Status: DC | PRN
  Administered 2015-10-17 (×2): 10 mL

## 2015-10-17 MED ORDER — CHLORHEXIDINE GLUCONATE 4 % EX LIQD: 60.0000 mL | Freq: Once | CUTANEOUS | Status: DC

## 2015-10-17 MED ORDER — PROPOFOL 10 MG/ML IV BOLUS
INTRAVENOUS | Status: DC | PRN
  Administered 2015-10-17: 20 mg via INTRAVENOUS

## 2015-10-17 MED ORDER — BUPIVACAINE-EPINEPHRINE (PF) 0.25% -1:200000 IJ SOLN
INTRAMUSCULAR | Status: DC | PRN
  Administered 2015-10-17: 20 mL

## 2015-10-17 MED ORDER — HYDROMORPHONE HCL 1 MG/ML IJ SOLN
1.0000 mg | INTRAMUSCULAR | Status: DC | PRN
  Administered 2015-10-17 – 2015-10-18 (×6): 1 mg via INTRAVENOUS
  Filled 2015-10-17 (×7): qty 1

## 2015-10-17 MED ORDER — PHENOL 1.4 % MT LIQD: 1.0000 | OROMUCOSAL | Status: DC | PRN

## 2015-10-17 MED ORDER — DEXAMETHASONE SODIUM PHOSPHATE 10 MG/ML IJ SOLN
INTRAMUSCULAR | Status: AC
  Filled 2015-10-17: qty 1

## 2015-10-17 MED ORDER — FUROSEMIDE 20 MG PO TABS: 20.0000 mg | ORAL_TABLET | Freq: Every day | ORAL | Status: DC | PRN

## 2015-10-17 MED ORDER — ASPIRIN EC 325 MG PO TBEC
325.0000 mg | DELAYED_RELEASE_TABLET | Freq: Two times a day (BID) | ORAL | Status: DC
  Administered 2015-10-17 – 2015-10-18 (×2): 325 mg via ORAL
  Filled 2015-10-17 (×2): qty 1

## 2015-10-17 MED ORDER — MIDAZOLAM HCL 2 MG/2ML IJ SOLN
INTRAMUSCULAR | Status: AC
  Filled 2015-10-17: qty 2

## 2015-10-17 MED ORDER — EPHEDRINE SULFATE 50 MG/ML IJ SOLN
INTRAMUSCULAR | Status: DC | PRN
  Administered 2015-10-17 (×2): 5 mg via INTRAVENOUS
  Administered 2015-10-17: 10 mg via INTRAVENOUS

## 2015-10-17 MED ORDER — OXYCODONE HCL ER 10 MG PO T12A
10.0000 mg | EXTENDED_RELEASE_TABLET | Freq: Two times a day (BID) | ORAL | Status: DC
  Administered 2015-10-17 – 2015-10-18 (×2): 10 mg via ORAL
  Filled 2015-10-17 (×2): qty 1

## 2015-10-17 MED ORDER — METOCLOPRAMIDE HCL 5 MG/ML IJ SOLN: 5.0000 mg | Freq: Three times a day (TID) | INTRAMUSCULAR | Status: DC | PRN

## 2015-10-17 MED ORDER — CITALOPRAM HYDROBROMIDE 20 MG PO TABS
20.0000 mg | ORAL_TABLET | Freq: Every day | ORAL | Status: DC
  Administered 2015-10-18: 20 mg via ORAL
  Filled 2015-10-17: qty 1

## 2015-10-17 MED ORDER — OXYCODONE HCL 5 MG PO TABS
5.0000 mg | ORAL_TABLET | ORAL | Status: DC | PRN
  Administered 2015-10-17 – 2015-10-18 (×5): 10 mg via ORAL
  Filled 2015-10-17 (×4): qty 2

## 2015-10-17 MED ORDER — SODIUM CHLORIDE 0.9 % IR SOLN
Status: DC | PRN
  Administered 2015-10-17 (×2): 1000 mL

## 2015-10-17 MED ORDER — LIDOCAINE 2% (20 MG/ML) 5 ML SYRINGE
INTRAMUSCULAR | Status: AC
  Filled 2015-10-17: qty 5

## 2015-10-17 MED ORDER — ONDANSETRON HCL 4 MG/2ML IJ SOLN
INTRAMUSCULAR | Status: DC | PRN
  Administered 2015-10-17: 4 mg via INTRAVENOUS

## 2015-10-17 MED ORDER — ONDANSETRON HCL 4 MG/2ML IJ SOLN: 4.0000 mg | Freq: Four times a day (QID) | INTRAMUSCULAR | Status: DC | PRN

## 2015-10-17 MED ORDER — DOCUSATE SODIUM 100 MG PO CAPS
100.0000 mg | ORAL_CAPSULE | Freq: Two times a day (BID) | ORAL | Status: DC
  Administered 2015-10-17 – 2015-10-18 (×2): 100 mg via ORAL
  Filled 2015-10-17 (×2): qty 1

## 2015-10-17 MED ORDER — METOCLOPRAMIDE HCL 5 MG PO TABS: 5.0000 mg | ORAL_TABLET | Freq: Three times a day (TID) | ORAL | Status: DC | PRN

## 2015-10-17 MED ORDER — OXYCODONE HCL 5 MG PO TABS
ORAL_TABLET | ORAL | Status: AC
  Filled 2015-10-17: qty 2

## 2015-10-17 MED ORDER — SODIUM CHLORIDE 0.9 % IV SOLN
1000.0000 mg | Freq: Once | INTRAVENOUS | Status: DC
  Filled 2015-10-17: qty 10

## 2015-10-17 MED ORDER — FENTANYL CITRATE (PF) 250 MCG/5ML IJ SOLN
INTRAMUSCULAR | Status: AC
  Filled 2015-10-17: qty 5

## 2015-10-17 MED ORDER — DONEPEZIL HCL 5 MG PO TABS
5.0000 mg | ORAL_TABLET | Freq: Every day | ORAL | Status: DC
  Administered 2015-10-17: 5 mg via ORAL
  Filled 2015-10-17: qty 1

## 2015-10-17 MED ORDER — SODIUM CHLORIDE 0.9 % IJ SOLN
INTRAMUSCULAR | Status: AC
  Filled 2015-10-17: qty 10

## 2015-10-17 MED ORDER — ACETAMINOPHEN 650 MG RE SUPP: 650.0000 mg | Freq: Four times a day (QID) | RECTAL | Status: DC | PRN

## 2015-10-17 MED ORDER — TRAZODONE HCL 100 MG PO TABS
100.0000 mg | ORAL_TABLET | Freq: Every day | ORAL | Status: DC
  Administered 2015-10-17: 100 mg via ORAL
  Filled 2015-10-17: qty 1

## 2015-10-17 MED ORDER — BUPIVACAINE-EPINEPHRINE (PF) 0.25% -1:200000 IJ SOLN
INTRAMUSCULAR | Status: AC
  Filled 2015-10-17: qty 30

## 2015-10-17 MED ORDER — PHENYLEPHRINE HCL 10 MG/ML IJ SOLN
INTRAMUSCULAR | Status: DC | PRN
  Administered 2015-10-17 (×3): 80 ug via INTRAVENOUS
  Administered 2015-10-17: 40 ug via INTRAVENOUS

## 2015-10-17 MED ORDER — SODIUM CHLORIDE 0.9 % IV SOLN
INTRAVENOUS | Status: DC
  Administered 2015-10-17: 19:00:00 via INTRAVENOUS

## 2015-10-17 MED ORDER — DEXAMETHASONE SODIUM PHOSPHATE 10 MG/ML IJ SOLN
INTRAMUSCULAR | Status: DC | PRN
  Administered 2015-10-17: 10 mg via INTRAVENOUS

## 2015-10-17 MED ORDER — FENTANYL CITRATE (PF) 100 MCG/2ML IJ SOLN
50.0000 ug | Freq: Once | INTRAMUSCULAR | Status: AC
  Administered 2015-10-17 (×3): 50 ug via INTRAVENOUS

## 2015-10-17 SURGICAL SUPPLY — 56 items
BANDAGE ELASTIC 6 VELCRO ST LF (GAUZE/BANDAGES/DRESSINGS) ×3 IMPLANT
BANDAGE ESMARK 6X9 LF (GAUZE/BANDAGES/DRESSINGS) ×1 IMPLANT
BLADE SAGITTAL 13X1.27X60 (BLADE) ×2 IMPLANT
BLADE SAGITTAL 13X1.27X60MM (BLADE) ×1
BLADE SAW SGTL 83.5X18.5 (BLADE) ×3 IMPLANT
BLADE SURG 10 STRL SS (BLADE) ×3 IMPLANT
BNDG ESMARK 6X9 LF (GAUZE/BANDAGES/DRESSINGS) ×3
BOWL SMART MIX CTS (DISPOSABLE) ×3 IMPLANT
CAPT KNEE TOTAL 3 ×3 IMPLANT
CEMENT BONE SIMPLEX SPEEDSET (Cement) ×6 IMPLANT
CLOSURE STERI-STRIP 1/2X4 (GAUZE/BANDAGES/DRESSINGS) ×1
CLSR STERI-STRIP ANTIMIC 1/2X4 (GAUZE/BANDAGES/DRESSINGS) ×2 IMPLANT
COVER SURGICAL LIGHT HANDLE (MISCELLANEOUS) ×3 IMPLANT
CUFF TOURNIQUET SINGLE 34IN LL (TOURNIQUET CUFF) ×3 IMPLANT
DRAPE EXTREMITY T 121X128X90 (DRAPE) ×3 IMPLANT
DRAPE INCISE IOBAN 66X45 STRL (DRAPES) ×6 IMPLANT
DRAPE PROXIMA HALF (DRAPES) ×3 IMPLANT
DRAPE U-SHAPE 47X51 STRL (DRAPES) ×3 IMPLANT
DRSG AQUACEL AG ADV 3.5X10 (GAUZE/BANDAGES/DRESSINGS) ×3 IMPLANT
DURAPREP 26ML APPLICATOR (WOUND CARE) ×3 IMPLANT
ELECT REM PT RETURN 9FT ADLT (ELECTROSURGICAL) ×3
ELECTRODE REM PT RTRN 9FT ADLT (ELECTROSURGICAL) ×1 IMPLANT
GLOVE BIOGEL M 7.0 STRL (GLOVE) IMPLANT
GLOVE BIOGEL PI IND STRL 7.5 (GLOVE) IMPLANT
GLOVE BIOGEL PI IND STRL 8.5 (GLOVE) ×5 IMPLANT
GLOVE BIOGEL PI INDICATOR 7.5 (GLOVE)
GLOVE BIOGEL PI INDICATOR 8.5 (GLOVE) ×10
GLOVE SURG ORTHO 8.0 STRL STRW (GLOVE) ×18 IMPLANT
GOWN STRL REUS W/ TWL LRG LVL3 (GOWN DISPOSABLE) ×1 IMPLANT
GOWN STRL REUS W/ TWL XL LVL3 (GOWN DISPOSABLE) ×2 IMPLANT
GOWN STRL REUS W/TWL 2XL LVL3 (GOWN DISPOSABLE) ×3 IMPLANT
GOWN STRL REUS W/TWL LRG LVL3 (GOWN DISPOSABLE) ×2
GOWN STRL REUS W/TWL XL LVL3 (GOWN DISPOSABLE) ×4
HANDPIECE INTERPULSE COAX TIP (DISPOSABLE) ×2
HOOD PEEL AWAY FACE SHEILD DIS (HOOD) ×9 IMPLANT
KIT BASIN OR (CUSTOM PROCEDURE TRAY) ×3 IMPLANT
KIT ROOM TURNOVER OR (KITS) ×3 IMPLANT
KNEE CAPITATED TOTAL 3 ×1 IMPLANT
MANIFOLD NEPTUNE II (INSTRUMENTS) ×3 IMPLANT
NEEDLE 22X1 1/2 (OR ONLY) (NEEDLE) ×6 IMPLANT
NS IRRIG 1000ML POUR BTL (IV SOLUTION) ×3 IMPLANT
PACK TOTAL JOINT (CUSTOM PROCEDURE TRAY) ×3 IMPLANT
PAD ARMBOARD 7.5X6 YLW CONV (MISCELLANEOUS) ×6 IMPLANT
SET HNDPC FAN SPRY TIP SCT (DISPOSABLE) ×1 IMPLANT
SUCTION FRAZIER HANDLE 10FR (MISCELLANEOUS) ×2
SUCTION TUBE FRAZIER 10FR DISP (MISCELLANEOUS) ×1 IMPLANT
SUT BONE WAX W31G (SUTURE) ×3 IMPLANT
SUT MNCRL AB 3-0 PS2 18 (SUTURE) ×3 IMPLANT
SUT VIC AB 0 CTB1 27 (SUTURE) ×6 IMPLANT
SUT VIC AB 1 CT1 27 (SUTURE) ×4
SUT VIC AB 1 CT1 27XBRD ANBCTR (SUTURE) ×2 IMPLANT
SUT VIC AB 2-0 CT1 27 (SUTURE) ×4
SUT VIC AB 2-0 CT1 TAPERPNT 27 (SUTURE) ×2 IMPLANT
SYR 20CC LL (SYRINGE) ×6 IMPLANT
TOWEL OR 17X24 6PK STRL BLUE (TOWEL DISPOSABLE) ×3 IMPLANT
TOWEL OR 17X26 10 PK STRL BLUE (TOWEL DISPOSABLE) ×3 IMPLANT

## 2015-10-17 NOTE — Addendum Note (Signed)
Addendum  created 10/17/15 1425 by [name], MD   Sign clinical note

## 2015-10-17 NOTE — Progress Notes (Signed)
2mg  versed and 45mcg fentanyl given by CRNA prior to block

## 2015-10-17 NOTE — Anesthesia Postprocedure Evaluation (Addendum)
Anesthesia Post Note  Patient: [name]  Procedure(s) Performed: Procedure(s) (LRB): TOTAL KNEE ARTHROPLASTY (Left)  Patient location during evaluation: PACU Anesthesia Type: Spinal and Regional Level of consciousness: oriented and awake and alert Pain management: pain level controlled Vital Signs Assessment: post-procedure vital signs reviewed and stable Respiratory status: spontaneous breathing, respiratory function stable and patient connected to nasal cannula oxygen Cardiovascular status: blood pressure returned to baseline and stable Postop Assessment: no headache and no backache Anesthetic complications: no    Last Vitals:  Vitals:   10/17/15 1316 10/17/15 1342  BP: 122/71 (!) 125/54  Pulse:    Resp: 13   Temp:  36.4 C    Last Pain:  Vitals:   10/17/15 1342  TempSrc: Oral  PainSc:                  [name]

## 2015-10-17 NOTE — Transfer of Care (Signed)
Immediate Anesthesia Transfer of Care Note  Patient: [name]  Procedure(s) Performed: Procedure(s): TOTAL KNEE ARTHROPLASTY (Left)  Patient Location: PACU  Anesthesia Type:Spinal  Level of Consciousness: awake, alert  and oriented  Airway & Oxygen Therapy: Patient Spontanous Breathing and Patient connected to face mask oxygen  Post-op Assessment: Report given to RN and Post -op Vital signs reviewed and stable  Post vital signs: Reviewed and stable  Last Vitals:  Vitals:   10/17/15 0833 10/17/15 1045  BP:    Pulse: 72   Resp: 18   Temp:  (P) 36.4 C    Last Pain:  Vitals:   10/17/15 1045  TempSrc:   PainSc: (P) 0-No pain      Patients Stated Pain Goal: 8 ([id number])  Complications: No apparent anesthesia complications

## 2015-10-17 NOTE — Anesthesia Procedure Notes (Signed)
Procedure Name: MAC Date/Time: 10/17/2015 8:48 AM Performed by: Huey Romans ANN Pre-anesthesia Checklist: Patient identified, Emergency Drugs available, Suction available and Patient being monitored Intubation Type: IV induction Ventilation: Nasal airway inserted- appropriate to patient size

## 2015-10-17 NOTE — H&P (Signed)
[name] MRN:  [id number] DOB/SEX:  1940-04-24/female  CHIEF COMPLAINT:  Painful left Knee  HISTORY: Patient is a 75 y.o. female presented with a history of pain in the left knee. Onset of symptoms was gradual starting a few years ago with gradually worsening course since that time. Patient has been treated conservatively with over-the-counter NSAIDs and activity modification. Patient currently rates pain in the knee at 10 out of 10 with activity. There is pain at night.  PAST MEDICAL HISTORY: There are no active problems to display for this patient.  Past Medical History:  Diagnosis Date  . Anxiety   . Degeneration of cervical intervertebral disc   . Generalized osteoarthrosis, involving multiple sites   . Radiculopathy, cervical   . Rosacea keratitis   . Varicose veins    Past Surgical History:  Procedure Laterality Date  . APPENDECTOMY    . BACK SURGERY  1997  . BREAST SURGERY     implants , removed later  . CHOLECYSTECTOMY    . KNEE ARTHROPLASTY Right    07  . SHOULDER ARTHROSCOPY DISTAL CLAVICLE EXCISION AND OPEN ROTATOR CUFF REPAIR Left   . TONSILLECTOMY       MEDICATIONS:   Prescriptions Prior to Admission  Medication Sig Dispense Refill Last Dose  . aspirin 81 MG tablet Take 81 mg by mouth daily.     . citalopram (CELEXA) 20 MG tablet Take 20 mg by mouth daily.     Marland Kitchen donepezil (ARICEPT) 5 MG tablet Take 5 mg by mouth at bedtime.      . furosemide (LASIX) 20 MG tablet Take 20 mg by mouth daily as needed for fluid.      Marland Kitchen glucosamine-chondroitin 500-400 MG tablet Take 1 tablet by mouth 3 (three) times daily as needed.      Marland Kitchen HYDROcodone-acetaminophen (NORCO/VICODIN) 5-325 MG per tablet Take 1 tablet by mouth 2 (two) times daily as needed for moderate pain.      Marland Kitchen ibuprofen (ADVIL,MOTRIN) 800 MG tablet Take 800 mg by mouth every 8 (eight) hours as needed for moderate pain.      . traZODone (DESYREL) 100 MG tablet Take 100 mg by mouth at bedtime.     .  valACYclovir (VALTREX) 1000 MG tablet Take 1,000 mg by mouth as needed.        ALLERGIES:   Allergies  Allergen Reactions  . Acetaminophen Other (See Comments)    GI UPSET  . Amoxicillin Rash  . Ampicillin Rash  . Codeine Phosphate Nausea Only    REVIEW OF SYSTEMS:  A comprehensive review of systems was negative except for: Musculoskeletal: positive for arthralgias and bone pain   FAMILY HISTORY:   Family History  Problem Relation Age of Onset  . Heart disease Mother   . Heart disease Father     SOCIAL HISTORY:   Social History  Substance Use Topics  . Smoking status: Never Smoker  . Smokeless tobacco: Never Used  . Alcohol use Yes     Comment: occ     EXAMINATION:  Vital signs in last 24 hours:    There were no vitals taken for this visit.  General Appearance:    Alert, cooperative, no distress, appears stated age  Head:    Normocephalic, without obvious abnormality, atraumatic  Eyes:    PERRL, conjunctiva/corneas clear, EOM's intact, fundi    benign, both eyes  Ears:    Normal TM's and external ear canals, both ears  Nose:   Nares normal, septum  midline, mucosa normal, no drainage    or sinus tenderness  Throat:   Lips, mucosa, and tongue normal; teeth and gums normal  Neck:   Supple, symmetrical, trachea midline, no adenopathy;    thyroid:  no enlargement/tenderness/nodules; no carotid   bruit or JVD  Back:     Symmetric, no curvature, ROM normal, no CVA tenderness  Lungs:     Clear to auscultation bilaterally, respirations unlabored  Chest Wall:    No tenderness or deformity   Heart:    Regular rate and rhythm, S1 and S2 normal, no murmur, rub   or gallop  Breast Exam:    No tenderness, masses, or nipple abnormality  Abdomen:     Soft, non-tender, bowel sounds active all four quadrants,    no masses, no organomegaly  Genitalia:    Normal female without lesion, discharge or tenderness  Rectal:    Normal tone, no masses or tenderness;   guaiac negative  stool  Extremities:   Extremities normal, atraumatic, no cyanosis or edema  Pulses:   2+ and symmetric all extremities  Skin:   Skin color, texture, turgor normal, no rashes or lesions  Lymph nodes:   Cervical, supraclavicular, and axillary nodes normal  Neurologic:   CNII-XII intact, normal strength, sensation and reflexes    throughout     Musculoskeletal:  ROM 0-120, Ligaments intact,  Imaging Review Plain radiographs demonstrate severe degenerative joint disease of the left knee. The overall alignment is neutral. The bone quality appears to be excellent for age and reported activity level.  Assessment/Plan: Primary osteoarthritis, left knee   The patient history, physical examination and imaging studies are consistent with advanced degenerative joint disease of the left knee. The patient has failed conservative treatment.  The clearance notes were reviewed.  After discussion with the patient it was felt that Total Knee Replacement was indicated. The procedure,  risks, and benefits of total knee arthroplasty were presented and reviewed. The risks including but not limited to aseptic loosening, infection, blood clots, vascular injury, stiffness, patella tracking problems complications among others were discussed. The patient acknowledged the explanation, agreed to proceed with the plan. Donia Ast 10/17/2015, 6:24 AM

## 2015-10-17 NOTE — Progress Notes (Signed)
Orthopedic Tech Progress Note Patient Details:  [name] 10-Feb-1941 [id number]  CPM Left Knee CPM Left Knee: On Left Knee Flexion (Degrees): 90 Left Knee Extension (Degrees): 0 Additional Comments: trapeze bar patient helper  Viewed order from doctor's order list Hildred Priest 10/17/2015, 11:19 AM

## 2015-10-17 NOTE — Evaluation (Signed)
Physical Therapy Evaluation Patient Details Name: Masaye Downes MRN: [id number] DOB: 1940-08-18 Today's Date: 10/17/2015   History of Present Illness  Patient is a 75 y.o female with hx of OA and anxiety s/p left TKA.  Clinical Impression  Patient presents with pain and post surgical deficits LLE s/p left TKA. Tolerated gait training with Min guard assist for safety. Education re: exercises, positioning, zero degree knee etc. Pt plans to discharge home with support of spouse, friend and son. Pt independent and working PTA. Will need to negotiate 2 steps to get into home. Will follow acutely to maximize independence and mobility prior to return home.    Follow Up Recommendations Home health PT;Supervision for mobility/OOB    Equipment Recommendations  None recommended by PT    Recommendations for Other Services       Precautions / Restrictions Precautions Precautions: Knee Precaution Booklet Issued: No Precaution Comments: Reviewed no pillow under knee and precautions. Restrictions Weight Bearing Restrictions: Yes LLE Weight Bearing: Weight bearing as tolerated      Mobility  Bed Mobility Overal bed mobility: Needs Assistance Bed Mobility: Supine to Sit     Supine to sit: Supervision;HOB elevated     General bed mobility comments: No assist needed. Use of rail.  Transfers Overall transfer level: Needs assistance Equipment used: Rolling walker (2 wheeled) Transfers: Sit to/from Stand Sit to Stand: Min guard         General transfer comment: Min guard for safety. Stood from [employer], from toilet x1. Transferred to chair post ambulation bout.  Ambulation/Gait Ambulation/Gait assistance: Min guard Ambulation Distance (Feet): 100 Feet Assistive device: Rolling walker (2 wheeled) Gait Pattern/deviations: Step-through pattern;Decreased stride length;Decreased stance time - left;Decreased step length - right Gait velocity: decreased Gait velocity interpretation: <1.8  ft/sec, indicative of risk for recurrent falls General Gait Details: Slow, steady gait. Cues for step through gait. Pain limiting distance.   Stairs            Wheelchair Mobility    Modified Rankin (Stroke Patients Only)       Balance Overall balance assessment: Needs assistance Sitting-balance support: Feet supported;No upper extremity supported Sitting balance-Leahy Scale: Good Sitting balance - Comments: Able toperform pericare without assist.    Standing balance support: During functional activity Standing balance-Leahy Scale: Poor Standing balance comment: Requires UE support in standing.                             Pertinent Vitals/Pain Pain Assessment: 0-10 Pain Score: 8  Pain Location: left knee Pain Descriptors / Indicators: Operative site guarding;Sore Pain Intervention(s): Limited activity within patient's tolerance;Repositioned;Monitored during session;Patient requesting pain meds-RN notified    Home Living Family/patient expects to be discharged to:: Private residence Living Arrangements: Spouse/significant other;Non-relatives/Friends Available Help at Discharge: Family;Available 24 hours/day Type of Home: House Home Access: Stairs to enter Entrance Stairs-Rails: None Entrance Stairs-Number of Steps: 2 small steps Home Layout: One level Home Equipment: Walker - 2 wheels;Walker - 4 wheels;Bedside commode;Shower seat;Cane - single point      Prior Function Level of Independence: Independent with assistive device(s)         Comments: Uses rollator for vacations but otherwise independent. Drives. 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     Hand Dominance        Extremity/Trunk Assessment   Upper Extremity Assessment: Defer to OT evaluation  Lower Extremity Assessment: LLE deficits/detail   LLE Deficits / Details: Able to perform SLR with extension lag. Post surgical limitations noted in ROM and  strength.     Communication   Communication: No difficulties  Cognition Arousal/Alertness: Awake/alert Behavior During Therapy: WFL for tasks assessed/performed Overall Cognitive Status: Within Functional Limits for tasks assessed                      General Comments General comments (skin integrity, edema, etc.): Son and spouse present during session.    Exercises Total Joint Exercises Ankle Circles/Pumps: Both;10 reps;Supine Quad Sets: Both;10 reps;Supine Gluteal Sets: Both;10 reps;Supine      Assessment/Plan    PT Assessment Patient needs continued PT services  PT Diagnosis Difficulty walking;Acute pain   PT Problem List Decreased strength;Decreased mobility;Decreased range of motion;Decreased activity tolerance;Pain;Impaired sensation;Decreased balance  PT Treatment Interventions DME instruction;Therapeutic activities;Gait training;Therapeutic exercise;Patient/family education;Stair training;Balance training;Functional mobility training   PT Goals (Current goals can be found in the Care Plan section) Acute Rehab PT Goals Patient Stated Goal: to get back to work PT Goal Formulation: With patient Time For Goal Achievement: 10/31/15 Potential to Achieve Goals: Good    Frequency 7X/week   Barriers to discharge        Co-evaluation               End of Session Equipment Utilized During Treatment: Gait belt Activity Tolerance: Patient tolerated treatment well Patient left: in chair;with call bell/phone within reach;with family/visitor present Nurse Communication: Mobility status         Time: [id number] PT Time Calculation (min) (ACUTE ONLY): 33 min   Charges:   PT Evaluation $PT Eval Low Complexity: 1 Procedure PT Treatments $Gait Training: 8-22 mins   PT G Codes:        Jazell Rosenau A Antion Andres 10/17/2015, 3:43 PM Wray Kearns, [place], DPT [phone]

## 2015-10-17 NOTE — Anesthesia Procedure Notes (Signed)
Anesthesia Regional Block:  Adductor canal block  Pre-Anesthetic Checklist: ,, timeout performed, Correct Patient, Correct Site, Correct Laterality, Correct Procedure, Correct Position, site marked, Risks and benefits discussed,  Surgical consent,  Pre-op evaluation,  At surgeon's request and post-op pain management  Laterality: Left  Prep: chloraprep       Needles:  Injection technique: Single-shot  Needle Type: Echogenic Needle     Needle Length: 9cm 9 cm Needle Gauge: 21 and 21 G    Additional Needles:  Procedures: ultrasound guided (picture in chart) Adductor canal block Narrative:  Start time: 10/17/2015 8:25 AM End time: 10/17/2015 8:30 AM Injection made incrementally with aspirations every 5 mL.  Performed by: Personally  Anesthesiologist: Suella Broad D  Additional Notes: No immediate complications noted. Pt tolerated well.

## 2015-10-18 ENCOUNTER — Encounter (HOSPITAL_COMMUNITY): Payer: Self-pay | Admitting: Orthopedic Surgery

## 2015-10-18 LAB — CBC
HEMATOCRIT: 36.4 % (ref 36.0–46.0)
HEMOGLOBIN: 11.9 g/dL — AB (ref 12.0–15.0)
MCH: 32.2 pg (ref 26.0–34.0)
MCHC: 32.7 g/dL (ref 30.0–36.0)
MCV: 98.4 fL (ref 78.0–100.0)
Platelets: 174 10*3/uL (ref 150–400)
RBC: 3.7 MIL/uL — AB (ref 3.87–5.11)
RDW: 12.5 % (ref 11.5–15.5)
WBC: 10.5 10*3/uL (ref 4.0–10.5)

## 2015-10-18 LAB — BASIC METABOLIC PANEL
ANION GAP: 7 (ref 5–15)
BUN: 15 mg/dL (ref 6–20)
CHLORIDE: 103 mmol/L (ref 101–111)
CO2: 29 mmol/L (ref 22–32)
Calcium: 9 mg/dL (ref 8.9–10.3)
Creatinine, Ser: 0.75 mg/dL (ref 0.44–1.00)
GFR calc non Af Amer: >60 mL/min (ref >60)
Glucose, Bld: 125 mg/dL — ABNORMAL HIGH (ref 65–99)
POTASSIUM: 4.5 mmol/L (ref 3.5–5.1)
SODIUM: 139 mmol/L (ref 135–145)

## 2015-10-18 MED ORDER — ASPIRIN 325 MG PO TBEC: 325.0000 mg | DELAYED_RELEASE_TABLET | Freq: Two times a day (BID) | ORAL | 0 refills | Status: DC

## 2015-10-18 MED ORDER — METHOCARBAMOL 500 MG PO TABS: 500.0000 mg | ORAL_TABLET | Freq: Four times a day (QID) | ORAL | 0 refills | Status: DC | PRN

## 2015-10-18 MED ORDER — OXYCODONE HCL 5 MG PO TABS: 5.0000 mg | ORAL_TABLET | ORAL | 0 refills | Status: DC | PRN

## 2015-10-18 NOTE — Progress Notes (Signed)
Reviewed discharge papers and medications with Mrs Walling and her husband with full understanding

## 2015-10-18 NOTE — Op Note (Signed)
TOTAL KNEE REPLACEMENT OPERATIVE NOTE:  10/17/2015  7:46 AM  PATIENT:  [name]  75 y.o. female  PRE-OPERATIVE DIAGNOSIS:  primary osteoarthritis left knee  POST-OPERATIVE DIAGNOSIS:  primary osteoarthritis left knee  PROCEDURE:  Procedure(s): TOTAL KNEE ARTHROPLASTY  SURGEON:  Surgeon(s): [name], MD  PHYSICIAN ASSISTANT: Nehemiah Massed, [facility]  ANESTHESIA:   spinal  DRAINS: Hemovac  SPECIMEN: None  COUNTS:  Correct  TOURNIQUET:   Total Tourniquet Time Documented: Thigh (Left) - 60 minutes Total: Thigh (Left) - 60 minutes   DICTATION:  Indication for procedure:    The patient is a 75 y.o. female who has failed conservative treatment for primary osteoarthritis left knee.  Informed consent was obtained prior to anesthesia. The risks versus benefits of the operation were explain and in a way the patient can, and did, understand.   On the implant demand matching protocol, this patient scored 8.  Therefore, this patient did" "did not receive a polyethylene insert with vitamin E which is a high demand implant.  Description of procedure:     The patient was taken to the operating room and placed under anesthesia.  The patient was positioned in the usual fashion taking care that all body parts were adequately padded and/or protected.  I foley catheter was not placed.  A tourniquet was applied and the leg prepped and draped in the usual sterile fashion.  The extremity was exsanguinated with the esmarch and tourniquet inflated to 350 mmHg.  Pre-operative range of motion was normal.  The knee was in 3 degree of mild varus.  A midline incision approximately 6-7 inches long was made with a #10 blade.  A new blade was used to make a parapatellar arthrotomy going 2-3 cm into the quadriceps tendon, over the patella, and alongside the medial aspect of the patellar tendon.  A synovectomy was then performed with the #10 blade and forceps. I then elevated the deep MCL off the medial  tibial metaphysis subperiosteally around to the semimembranosus attachment.    I everted the patella and used calipers to measure patellar thickness.  I used the reamer to ream down to appropriate thickness to recreate the native thickness.  I then removed excess bone with the rongeur and sagittal saw.  I used the appropriately sized template and drilled the three lug holes.  I then put the trial in place and measured the thickness with the calipers to ensure recreation of the native thickness.  The trial was then removed and the patella subluxed and the knee brought into flexion.  A homan retractor was place to retract and protect the patella and lateral structures.  A Z-retractor was place medially to protect the medial structures.  The extra-medullary alignment system was used to make cut the tibial articular surface perpendicular to the anamotic axis of the tibia and in 3 degrees of posterior slope.  The cut surface and alignment jig was removed.  I then used the intramedullary alignment guide to make a 6 valgus cut on the distal femur.  I then marked out the epicondylar axis on the distal femur.  The posterior condylar axis measured 3 degrees.  I then used the anterior referencing sizer and measured the femur to be a size 6.  The 4-In-1 cutting block was screwed into place in external rotation matching the posterior condylar angle, making our cuts perpendicular to the epicondylar axis.  Anterior, posterior and chamfer cuts were made with the sagittal saw.  The cutting block and cut pieces were  removed.  A lamina spreader was placed in 90 degrees of flexion.  The ACL, PCL, menisci, and posterior condylar osteophytes were removed.  A 10 mm spacer blocked was found to offer good flexion and extension gap balance after mild in degree releasing.   The scoop retractor was then placed and the femoral finishing block was pinned in place.  The small sagittal saw was used as well as the lug drill to finish the  femur.  The block and cut surfaces were removed and the medullary canal hole filled with autograft bone from the cut pieces.  The tibia was delivered forward in deep flexion and external rotation.  A size D tray was selected and pinned into place centered on the medial 1/3 of the tibial tubercle.  The reamer and keel was used to prepare the tibia through the tray.    I then trialed with the size 6 femur, size D tibia, a 10 mm insert and the 30 patella.  I had excellent flexion/extension gap balance, excellent patella tracking.  Flexion was full and beyond 120 degrees; extension was zero.  These components were chosen and the staff opened them to me on the back table while the knee was lavaged copiously and the cement mixed.  The soft tissue was infiltrated with 60cc of exparel 1.3% through a 21 gauge needle.  I cemented in the components and removed all excess cement.  The polyethylene tibial component was snapped into place and the knee placed in extension while cement was hardening.  The capsule was infilltrated with 30cc of .25% Marcaine with epinephrine.  A hemovac was place in the joint exiting superolaterally.  A pain pump was place superomedially superficial to the arthrotomy.  Once the cement was hard, the tourniquet was let down.  Hemostasis was obtained.  The arthrotomy was closed with figure-8 #1 vicryl sutures.  The deep soft tissues were closed with #0 vicryls and the subcuticular layer closed with a running #2-0 vicryl.  The skin was reapproximated and closed with skin staples.  The wound was dressed with xeroform, 4 x4's, 2 ABD sponges, a single layer of webril and a TED stocking.   The patient was then awakened, extubated, and taken to the recovery room in stable condition.  BLOOD LOSS:  300cc DRAINS: 1 hemovac, 1 pain catheter COMPLICATIONS:  None.  PLAN OF CARE: Admit to inpatient   PATIENT DISPOSITION:  PACU - hemodynamically stable.   Delay start of Pharmacological VTE agent  (>24hrs) due to surgical blood loss or risk of bleeding:  not applicable  Please fax a copy of this op note to my office at [phone] (please only include page 1 and 2 of the Case Information op note)

## 2015-10-18 NOTE — Progress Notes (Signed)
SPORTS MEDICINE AND JOINT REPLACEMENT  Lara Mulch, MD    Carlyon Shadow, PA-C [address], [place], [place]  [address]                             [phone]   PROGRESS NOTE  Subjective:  negative for Chest Pain  negative for Shortness of Breath  negative for Nausea/Vomiting   negative for Calf Pain  negative for Bowel Movement   Tolerating Diet: yes         Patient reports pain as 5 on 0-10 scale.    Objective: Vital signs in last 24 hours:   Patient Vitals for the past 24 hrs:  BP Temp Temp src Pulse Resp SpO2  10/18/15 0531 (!) 113/50 97.8 F (36.6 C) Oral 72 17 99 %  10/18/15 0130 (!) 117/58 97.7 F (36.5 C) Oral 75 16 97 %  10/17/15 2110 (!) 124/57 97.9 F (36.6 C) Oral 82 15 90 %  10/17/15 1342 (!) 125/54 97.6 F (36.4 C) Oral - - 93 %  10/17/15 1316 122/71 - - - 13 -  10/17/15 1300 (!) 124/58 - - 76 13 96 %  10/17/15 1245 (!) 122/57 - - 72 15 97 %  10/17/15 1230 (!) 112/59 - - 73 15 97 %  10/17/15 1215 (!) 113/59 - - 73 13 97 %    @flow {1959:LAST@   Intake/Output from previous day:   08/14 0701 - 08/15 0700 In: 1720 [P.O.:520; I.V.:1200] Out: 400 [Urine:350]   Intake/Output this shift:   No intake/output data recorded.   Intake/Output      08/14 0701 - 08/15 0700 08/15 0701 - 08/16 0700   P.O. 520    I.V. (mL/kg) 1200 (12.2)    Total Intake(mL/kg) 1720 (17.5)    Urine (mL/kg/hr) 350 (0.1)    Blood 50 (0)    Total Output 400     Net +1320          Urine Occurrence 5 x       LABORATORY DATA:  Recent Labs  10/18/15 0650  WBC 10.5  HGB 11.9*  HCT 36.4  PLT 174    Recent Labs  10/18/15 0650  NA 139  K 4.5  CL 103  CO2 29  BUN 15  CREATININE 0.75  GLUCOSE 125*  CALCIUM 9.0   Lab Results  Component Value Date   INR 1.05 10/07/2015   INR 1.0 04/16/2007    Examination:  General appearance: alert, cooperative and no distress Extremities: extremities normal, atraumatic, no cyanosis or edema  Wound Exam: clean, dry,  intact   Drainage:  None: wound tissue dry  Motor Exam: Quadriceps and Hamstrings Intact  Sensory Exam: Superficial Peroneal, Deep Peroneal and Tibial normal   Assessment:    1 Day Post-Op  Procedure(s) (LRB): TOTAL KNEE ARTHROPLASTY (Left)  ADDITIONAL DIAGNOSIS:  Active Problems:   S/P total knee replacement  Acute Blood Loss Anemia   Plan: Physical Therapy as ordered Weight Bearing as Tolerated (WBAT)  DVT Prophylaxis:  Aspirin  DISCHARGE PLAN: Home  DISCHARGE NEEDS: HHPT   Patient is doing great. Will D/C to go home after PT this afternoon         Donia Ast 10/18/2015, 12:08 PM

## 2015-10-18 NOTE — Evaluation (Signed)
Occupational Therapy Evaluation and Discharge Patient Details Name: [name] MRN: [id number] DOB: 1940/11/14 Today's Date: 10/18/2015    History of Present Illness Patient is a 75 y.o female with hx of OA and anxiety s/p left TKA.   Clinical Impression   All education completed and pt is ready for discharge. Requires supervision for ADL transfers. Pt's family to purchase AE kit prior to discharge home.    Follow Up Recommendations  No OT follow up    Equipment Recommendations  None recommended by OT    Recommendations for Other Services       Precautions / Restrictions Precautions Precautions: Knee Precaution Booklet Issued: No Precaution Comments: Reviewed no pillow under knee and precautions. Restrictions Weight Bearing Restrictions: Yes LLE Weight Bearing: Weight bearing as tolerated      Mobility Bed Mobility      General bed mobility comments: pt in chair  Transfers Overall transfer level: Needs assistance Equipment used: Rolling walker (2 wheeled) Transfers: Sit to/from Stand Sit to Stand: Supervision         General transfer comment: supervision from recliner and 3 in 1, heavily reliant on UEs to stand    Balance Overall balance assessment: Needs assistance Sitting-balance support: Feet supported;No upper extremity supported Sitting balance-Leahy Scale: Good    Standing balance support: During functional activity Standing balance-Leahy Scale: Fair Standing balance comment: able to release walker in standing for pericare and to manage underwear                            ADL Overall ADL's : Needs assistance/impaired Eating/Feeding: Independent;Sitting   Grooming: Wash/dry hands;Standing;Supervision/safety   Upper Body Bathing: Set up;Sitting   Lower Body Bathing: Moderate assistance;Sit to/from stand Lower Body Bathing Details (indicate cue type and reason): educated pt in use of long handled bath sponge Upper Body  Dressing : Set up;Sitting   Lower Body Dressing: Moderate assistance;Sit to/from stand Lower Body Dressing Details (indicate cue type and reason): educated pt in use of reacher, sock aid, and long handled shoe horn, educated pt to use straps on the back of her crocs to avoid falls with backing up, per pt she cannot fit into any other shoes Toilet Transfer: Supervision/safety;Ambulation;RW   Toileting- Clothing Manipulation and Hygiene: Supervision/safety;Sit to/from Nurse, children's Details (indicate cue type and reason): educated in technique for shower transfer, pt will use a shower seat and is agreeable to supervision for safety Functional mobility during ADLs: Supervision/safety;Rolling walker       Vision     Perception     Praxis      Pertinent Vitals/Pain Pain Assessment: Faces Faces Pain Scale: Hurts whole lot Pain Location: L knee Pain Descriptors / Indicators: Aching Pain Intervention(s): Repositioned;Ice applied;Monitored during session;Patient requesting pain meds-RN notified     Hand Dominance Right   Extremity/Trunk Assessment Upper Extremity Assessment Upper Extremity Assessment: Overall WFL for tasks assessed   Lower Extremity Assessment Lower Extremity Assessment: Defer to PT evaluation       Communication Communication Communication: No difficulties   Cognition Arousal/Alertness: Awake/alert Behavior During Therapy: WFL for tasks assessed/performed Overall Cognitive Status: Within Functional Limits for tasks assessed       Memory:  (pt using note strategy for memory)             General Comments       Exercises       Shoulder Instructions  Home Living Family/patient expects to be discharged to:: Private residence Living Arrangements: Spouse/significant other;Non-relatives/Friends Available Help at Discharge: Family;Available 24 hours/day Type of Home: House Home Access: Stairs to enter [employer] of  Steps: 2 small steps Entrance Stairs-Rails: None Home Layout: One level     Bathroom Shower/Tub: Occupational psychologist: Standard     Home Equipment: Environmental consultant - 2 wheels;Walker - 4 wheels;Bedside commode;Shower seat;Cane - single point;Hand held shower head          Prior Functioning/Environment Level of Independence: Independent with assistive device(s)        Comments: Uses rollator for vacations but otherwise independent. Drives. 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    OT Diagnosis: Generalized weakness;Acute pain   OT Problem List:     OT Treatment/Interventions:      OT Goals(Current goals can be found in the care plan section) Acute Rehab OT Goals Patient Stated Goal: to get back to work  OT Frequency:     Barriers to D/C:            Co-evaluation              End of Session Equipment Utilized During Treatment: Gait belt;Rolling walker CPM Left Knee CPM Left Knee: Off Nurse Communication: Patient requests pain meds  Activity Tolerance: Patient tolerated treatment well Patient left: in chair;with call bell/phone within reach   Time: 1221-1245 OT Time Calculation (min): 24 min Charges:  OT General Charges $OT Visit: 1 Procedure OT Evaluation $OT Eval Low Complexity: 1 Procedure OT Treatments $Self Care/Home Management : 8-22 mins G-Codes:    [name] 10/18/2015, 12:54 PM [phone]

## 2015-10-18 NOTE — Progress Notes (Signed)
Physical Therapy Treatment Patient Details Name: [name] MRN: [id number] DOB: 04/11/1940 Today's Date: 10/18/2015    History of Present Illness Patient is a 75 y.o female with hx of OA and anxiety s/p left TKA.    PT Comments    Patient progressing well towards PT goals. Reports more pain today. Tolerated gait training with Min guard assist for safety and cues for knee extension during stance phase to activate quads. Provided handout and instructed pt in exercises. Will plan for stair training in PM session prior to discharge. Will follow.   Follow Up Recommendations  Home health PT;Supervision for mobility/OOB     Equipment Recommendations  None recommended by PT    Recommendations for Other Services       Precautions / Restrictions Precautions Precautions: Knee Precaution Booklet Issued: No Precaution Comments: Reviewed no pillow under knee and precautions. Restrictions Weight Bearing Restrictions: Yes LLE Weight Bearing: Weight bearing as tolerated    Mobility  Bed Mobility Overal bed mobility: Needs Assistance Bed Mobility: Supine to Sit     Supine to sit: Supervision;HOB elevated     General bed mobility comments: No assist needed. Use of rail.  Transfers Overall transfer level: Needs assistance Equipment used: Rolling walker (2 wheeled) Transfers: Sit to/from Stand Sit to Stand: Min guard         General transfer comment: Min guard for safety. Stood from [employer], from toilet x1. Transferred to chair post ambulation bout.  Ambulation/Gait Ambulation/Gait assistance: Min guard Ambulation Distance (Feet): 175 Feet Assistive device: Rolling walker (2 wheeled) Gait Pattern/deviations: Step-through pattern;Decreased stride length;Decreased stance time - left;Decreased step length - right Gait velocity: decreased   General Gait Details: Slow, steady gait. 2 standing rest breaks. DOE. Cues for step through gait.    Stairs             Wheelchair Mobility    Modified Rankin (Stroke Patients Only)       Balance Overall balance assessment: Needs assistance Sitting-balance support: Feet supported;No upper extremity supported Sitting balance-Leahy Scale: Good Sitting balance - Comments: Assist to donn underwear but independent for peri care.   Standing balance support: During functional activity Standing balance-Leahy Scale: Fair Standing balance comment: Able to stand with 1 UE support to pull up underwear.                    Cognition Arousal/Alertness: Awake/alert Behavior During Therapy: WFL for tasks assessed/performed Overall Cognitive Status: Within Functional Limits for tasks assessed                      Exercises Total Joint Exercises Ankle Circles/Pumps: Both;10 reps;Supine Quad Sets: Both;10 reps;Supine Heel Slides: Left;10 reps;Supine;Seated Hip ABduction/ADduction: Left;10 reps;Supine Goniometric ROM: 10-90 degrees knee AROM    General Comments        Pertinent Vitals/Pain Pain Assessment: Faces Faces Pain Scale: Hurts whole lot Pain Location: left knee Pain Descriptors / Indicators: Operative site guarding;Sore Pain Intervention(s): Monitored during session;Patient requesting pain meds-RN notified;Repositioned    Home Living                      Prior Function            PT Goals (current goals can now be found in the care plan section) Progress towards PT goals: Progressing toward goals    Frequency  7X/week    PT Plan Current plan remains appropriate    Co-evaluation  End of Session Equipment Utilized During Treatment: Gait belt Activity Tolerance: Patient tolerated treatment well Patient left: in chair;with call bell/phone within reach     Time: 0931-1002 PT Time Calculation (min) (ACUTE ONLY): 31 min  Charges:  $Gait Training: 8-22 mins $Therapeutic Activity: 8-22 mins                    G Codes:      Holle Sprick A  Analyssa Downs 10/18/2015, 10:29 AM Wray Kearns, [place], DPT [phone]

## 2015-10-18 NOTE — Progress Notes (Signed)
Physical Therapy Treatment Patient Details Name: [name] MRN: [id number] DOB: [birth year] Today's Date: 10/18/2015    History of Present Illness Patient is a 75 y.o female with hx of OA and anxiety s/p left TKA.    PT Comments    Patient progressing well with mobility. Performed stair training this session with husband present. Ambulating and performing transfers supervision for safety. Encouraged exercises and mobility throughout the day. Pt eager to return home and plans to discharge home later today. Discussed car transfer technique. Will follow if still in hospital tomorrow.   Follow Up Recommendations  Home health PT;Supervision for mobility/OOB     Equipment Recommendations  None recommended by PT    Recommendations for Other Services       Precautions / Restrictions Precautions Precautions: Knee Precaution Booklet Issued: No Precaution Comments: Reviewed no pillow under knee and precautions. Restrictions Weight Bearing Restrictions: Yes LLE Weight Bearing: Weight bearing as tolerated    Mobility  Bed Mobility Overal bed mobility: Needs Assistance Bed Mobility: Sit to Supine     Supine to sit: Supervision;HOB elevated Sit to supine: Modified independent (Device/Increase time)   General bed mobility comments: HOB flat, no use of rails to simulate home.  Transfers Overall transfer level: Needs assistance Equipment used: Rolling walker (2 wheeled) Transfers: Sit to/from Stand Sit to Stand: Supervision         General transfer comment: Supervision for safety. Stood from chair x2, from toilet x1.   Ambulation/Gait Ambulation/Gait assistance: Supervision Ambulation Distance (Feet): 200 Feet Assistive device: Rolling walker (2 wheeled) Gait Pattern/deviations: Step-through pattern;Decreased stance time - left;Wide base of support Gait velocity: decreased   General Gait Details: Cues for knee extension during stance phase to activate quads and for knee  flexion during swing.    Stairs Stairs: Yes Stairs assistance: Min assist Stair Management: Backwards;With walker;Step to pattern Number of Stairs: 2 General stair comments: Cues for technique and husband present to stabilize RW.   Wheelchair Mobility    Modified Rankin (Stroke Patients Only)       Balance Overall balance assessment: Needs assistance Sitting-balance support: Feet supported;No upper extremity supported Sitting balance-Leahy Scale: Good Sitting balance - Comments: Assist to donn underwear but independent for peri care.   Standing balance support: During functional activity Standing balance-Leahy Scale: Fair Standing balance comment: able to release walker in standing for pericare and to manage underwear                    Cognition Arousal/Alertness: Awake/alert Behavior During Therapy: WFL for tasks assessed/performed Overall Cognitive Status: Within Functional Limits for tasks assessed       Memory:  (pt using note strategy for memory)              Exercises     General Comments General comments (skin integrity, edema, etc.): Spouse present during session.      Pertinent Vitals/Pain Pain Assessment: Faces Faces Pain Scale: Hurts a little bit Pain Location: left knee Pain Descriptors / Indicators: Sore Pain Intervention(s): Monitored during session;Premedicated before session;Repositioned    Home Living Family/patient expects to be discharged to:: Private residence Living Arrangements: Spouse/significant other;Non-relatives/Friends Available Help at Discharge: Family;Available 24 hours/day Type of Home: House Home Access: Stairs to enter Entrance Stairs-Rails: None Home Layout: One level Home Equipment: Environmental consultant - 2 wheels;Walker - 4 wheels;Bedside commode;Shower seat;Cane - single point;Hand held shower head      Prior Function Level of Independence: Independent with assistive device(s)  Comments: Uses rollator for  vacations but otherwise independent. Drives. 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   PT Goals (current goals can now be found in the care plan section) Acute Rehab PT Goals Patient Stated Goal: to get back to work Progress towards PT goals: Progressing toward goals    Frequency  7X/week    PT Plan Current plan remains appropriate    Co-evaluation             End of Session Equipment Utilized During Treatment: Gait belt Activity Tolerance: Patient tolerated treatment well Patient left: in bed;with call bell/phone within reach;with family/visitor present     Time: 1354-1420 PT Time Calculation (min) (ACUTE ONLY): 26 min  Charges:  $Gait Training: 8-22 mins $Therapeutic Activity: 8-22 mins                    G Codes:      [name] 10/18/2015, 2:25 PM [name], [name], [name] [phone]

## 2015-10-18 NOTE — Discharge Summary (Signed)
SPORTS MEDICINE & JOINT REPLACEMENT   Lara Mulch, MD   Carlyon Shadow, PA-C [place], [place], [place]  [address]                             [phone]  PATIENT ID: [name]        MRN:  [id number]          DOB/AGE: 77-29-1942 / 77 y.o.    DISCHARGE SUMMARY  ADMISSION DATE:    10/17/2015 DISCHARGE DATE:   10/18/2015   ADMISSION DIAGNOSIS: primary osteoarthritis left knee    DISCHARGE DIAGNOSIS:  primary osteoarthritis left knee    ADDITIONAL DIAGNOSIS: Active Problems:   S/P total knee replacement  Past Medical History:  Diagnosis Date  . Anxiety   . Degeneration of cervical intervertebral disc   . Generalized osteoarthrosis, involving multiple sites   . Radiculopathy, cervical   . Rosacea keratitis   . Varicose veins     PROCEDURE: Procedure(s): TOTAL KNEE ARTHROPLASTY on 10/17/2015  CONSULTS:    HISTORY:  See H&P in chart  HOSPITAL COURSE:  [name] is a 75 y.o. admitted on 10/17/2015 and found to have a diagnosis of primary osteoarthritis left knee.  After appropriate laboratory studies were obtained  they were taken to the operating room on 10/17/2015 and underwent Procedure(s): TOTAL KNEE ARTHROPLASTY.   They were given perioperative antibiotics:  Anti-infectives    Start     Dose/Rate Route Frequency Ordered Stop   10/17/15 1500  clindamycin (CLEOCIN) IVPB 600 mg     600 mg 100 mL/hr over 30 Minutes Intravenous Every 6 hours 10/17/15 1335 10/17/15 2106   10/17/15 0800  clindamycin (CLEOCIN) IVPB 900 mg     900 mg 100 mL/hr over 30 Minutes Intravenous To ShortStay Surgical 10/14/15 1110 10/17/15 0857    .  Patient given tranexamic acid IV or topical and exparel intra-operatively.  Tolerated the procedure well.    POD# 1: Vital signs were stable.  Patient denied Chest pain, shortness of breath, or calf pain.  Patient was started on Lovenox 30 mg subcutaneously twice daily at 8am.  Consults to PT, OT, and care management were made.   The patient was weight bearing as tolerated.  CPM was placed on the operative leg 0-90 degrees for 6-8 hours a day. When out of the CPM, patient was placed in the foam block to achieve full extension. Incentive spirometry was taught.  Dressing was changed.       POD #2, Continued  PT for ambulation and exercise program.  IV saline locked.  O2 discontinued.    The remainder of the hospital course was dedicated to ambulation and strengthening.   The patient was discharged on 1 Day Post-Op in  Good condition.  Blood products given:none  DIAGNOSTIC STUDIES: Recent vital signs: Patient Vitals for the past 24 hrs:  BP Temp Temp src Pulse Resp SpO2  10/18/15 0531 (!) 113/50 97.8 F (36.6 C) Oral 72 17 99 %  10/18/15 0130 (!) 117/58 97.7 F (36.5 C) Oral 75 16 97 %  10/17/15 2110 (!) 124/57 97.9 F (36.6 C) Oral 82 15 90 %  10/17/15 1342 (!) 125/54 97.6 F (36.4 C) Oral - - 93 %  10/17/15 1316 122/71 - - - 13 -  10/17/15 1300 (!) 124/58 - - 76 13 96 %  10/17/15 1245 (!) 122/57 - - 72 15 97 %  10/17/15 1230 (!) 112/59 - -  73 15 97 %  10/17/15 1215 (!) 113/59 - - 73 13 97 %       Recent laboratory studies:  Recent Labs  10/18/15 0650  WBC 10.5  HGB 11.9*  HCT 36.4  PLT 174    Recent Labs  10/18/15 0650  NA 139  K 4.5  CL 103  CO2 29  BUN 15  CREATININE 0.75  GLUCOSE 125*  CALCIUM 9.0   Lab Results  Component Value Date   INR 1.05 10/07/2015   INR 1.0 04/16/2007     Recent Radiographic Studies :  Dg Chest 2 View  Result Date: 10/07/2015 CLINICAL DATA:  Preop evaluation prior to total knee arthroplasty. EXAM: CHEST  2 VIEW COMPARISON:  04/16/2007. FINDINGS: The cardiac silhouette is near the upper limit of normal in size. The aorta is tortuous and minimally calcified. Small amount of linear scarring at the left lung base, without significant change. Otherwise, clear lungs. Minimal diffuse peribronchial thickening. Cholecystectomy clips. Mild thoracic spine degenerative  changes. IMPRESSION: 1. No acute abnormality. 2. Minimal chronic bronchitic changes. Electronically Signed   By: Claudie Revering M.D.   On: 10/07/2015 11:50    DISCHARGE INSTRUCTIONS: Discharge Instructions    CPM    Complete by:  As directed   Continuous passive motion machine (CPM):      Use the CPM from 0 to 90 for 4-6 hours per day.      You may increase by 10 per day.  You may break it up into 2 or 3 sessions per day.      Use CPM for 2 weeks or until you are told to stop.   Call MD / Call 911    Complete by:  As directed   If you experience chest pain or shortness of breath, CALL 911 and be transported to the hospital emergency room.  If you develope a fever above 101 F, pus (white drainage) or increased drainage or redness at the wound, or calf pain, call your surgeon's office.   Constipation Prevention    Complete by:  As directed   Drink plenty of fluids.  Prune juice may be helpful.  You may use a stool softener, such as Colace (over the counter) 100 mg twice a day.  Use MiraLax (over the counter) for constipation as needed.   Diet - low sodium heart healthy    Complete by:  As directed   Discharge instructions    Complete by:  As directed   INSTRUCTIONS AFTER JOINT REPLACEMENT   Remove items at home which could result in a fall. This includes throw rugs or furniture in walking pathways ICE to the affected joint every three hours while awake for 30 minutes at a time, for at least the first 3-5 days, and then as needed for pain and swelling.  Continue to use ice for pain and swelling. You may notice swelling that will progress down to the foot and ankle.  This is normal after surgery.  Elevate your leg when you are not up walking on it.   Continue to use the breathing machine you got in the hospital (incentive spirometer) which will help keep your temperature down.  It is common for your temperature to cycle up and down following surgery, especially at night when you are not up moving  around and exerting yourself.  The breathing machine keeps your lungs expanded and your temperature down.   DIET:  As you were doing prior to hospitalization, we recommend  a well-balanced diet.  DRESSING / WOUND CARE / SHOWERING  Keep the surgical dressing until follow up.  The dressing is water proof, so you can shower without any extra covering.  IF THE DRESSING FALLS OFF or the wound gets wet inside, change the dressing with sterile gauze.  Please use good hand washing techniques before changing the dressing.  Do not use any lotions or creams on the incision until instructed by your surgeon.    ACTIVITY  Increase activity slowly as tolerated, but follow the weight bearing instructions below.   No driving for 6 weeks or until further direction given by your physician.  You cannot drive while taking narcotics.  No lifting or carrying greater than 10 lbs. until further directed by your surgeon. Avoid periods of inactivity such as sitting longer than an hour when not asleep. This helps prevent blood clots.  You may return to work once you are authorized by your doctor.     WEIGHT BEARING   Weight bearing as tolerated with assist device (walker, cane, etc) as directed, use it as long as suggested by your surgeon or therapist, typically at least 4-6 weeks.   EXERCISES  Results after joint replacement surgery are often greatly improved when you follow the exercise, range of motion and muscle strengthening exercises prescribed by your doctor. Safety measures are also important to protect the joint from further injury. Any time any of these exercises cause you to have increased pain or swelling, decrease what you are doing until you are comfortable again and then slowly increase them. If you have problems or questions, call your caregiver or physical therapist for advice.   Rehabilitation is important following a joint replacement. After just a few days of immobilization, the muscles of the leg  can become weakened and shrink (atrophy).  These exercises are designed to build up the tone and strength of the thigh and leg muscles and to improve motion. Often times heat used for twenty to thirty minutes before working out will loosen up your tissues and help with improving the range of motion but do not use heat for the first two weeks following surgery (sometimes heat can increase post-operative swelling).   These exercises can be done on a training (exercise) mat, on the floor, on a table or on a bed. Use whatever works the best and is most comfortable for you.    Use music or television while you are exercising so that the exercises are a pleasant break in your day. This will make your life better with the exercises acting as a break in your routine that you can look forward to.   Perform all exercises about fifteen times, three times per day or as directed.  You should exercise both the operative leg and the other leg as well.   Exercises include:   Quad Sets - Tighten up the muscle on the front of the thigh (Quad) and hold for 5-10 seconds.   Straight Leg Raises - With your knee straight (if you were given a brace, keep it on), lift the leg to 60 degrees, hold for 3 seconds, and slowly lower the leg.  Perform this exercise against resistance later as your leg gets stronger.  Leg Slides: Lying on your back, slowly slide your foot toward your buttocks, bending your knee up off the floor (only go as far as is comfortable). Then slowly slide your foot back down until your leg is flat on the floor again.  Angel Wings:  Lying on your back spread your legs to the side as far apart as you can without causing discomfort.  Hamstring Strength:  Lying on your back, push your heel against the floor with your leg straight by tightening up the muscles of your buttocks.  Repeat, but this time bend your knee to a comfortable angle, and push your heel against the floor.  You may put a pillow under the heel to make  it more comfortable if necessary.   A rehabilitation program following joint replacement surgery can speed recovery and prevent re-injury in the future due to weakened muscles. Contact your doctor or a physical therapist for more information on knee rehabilitation.    CONSTIPATION  Constipation is defined medically as fewer than three stools per week and severe constipation as less than one stool per week.  Even if you have a regular bowel pattern at home, your normal regimen is likely to be disrupted due to multiple reasons following surgery.  Combination of anesthesia, postoperative narcotics, change in appetite and fluid intake all can affect your bowels.   YOU MUST use at least one of the following options; they are listed in order of increasing strength to get the job done.  They are all available over the counter, and you may need to use some, POSSIBLY even all of these options:    Drink plenty of fluids (prune juice may be helpful) and high fiber foods Colace 100 mg by mouth twice a day  Senokot for constipation as directed and as needed Dulcolax (bisacodyl), take with full glass of water  Miralax (polyethylene glycol) once or twice a day as needed.  If you have tried all these things and are unable to have a bowel movement in the first 3-4 days after surgery call either your surgeon or your primary doctor.    If you experience loose stools or diarrhea, hold the medications until you stool forms back up.  If your symptoms do not get better within 1 week or if they get worse, check with your doctor.  If you experience "the worst abdominal pain ever" or develop nausea or vomiting, please contact the office immediately for further recommendations for treatment.   ITCHING:  If you experience itching with your medications, try taking only a single pain pill, or even half a pain pill at a time.  You can also use Benadryl over the counter for itching or also to help with sleep.   TED HOSE  STOCKINGS:  Use stockings on both legs until for at least 2 weeks or as directed by physician office. They may be removed at night for sleeping.  MEDICATIONS:  See your medication summary on the "After Visit Summary" that nursing will review with you.  You may have some home medications which will be placed on hold until you complete the course of blood thinner medication.  It is important for you to complete the blood thinner medication as prescribed.  PRECAUTIONS:  If you experience chest pain or shortness of breath - call 911 immediately for transfer to the hospital emergency department.   If you develop a fever greater that 101 F, purulent drainage from wound, increased redness or drainage from wound, foul odor from the wound/dressing, or calf pain - CONTACT YOUR SURGEON.  FOLLOW-UP APPOINTMENTS:  If you do not already have a post-op appointment, please call the office for an appointment to be seen by your surgeon.  Guidelines for how soon to be seen are listed in your "After Visit Summary", but are typically between 1-4 weeks after surgery.  OTHER INSTRUCTIONS:   Knee Replacement:  Do not place pillow under knee, focus on keeping the knee straight while resting. CPM instructions: 0-90 degrees, 2 hours in the morning, 2 hours in the afternoon, and 2 hours in the evening. Place foam block, curve side up under heel at all times except when in CPM or when walking.  DO NOT modify, tear, cut, or change the foam block in any way.  MAKE SURE YOU:  Understand these instructions.  Get help right away if you are not doing well or get worse.    Thank you for letting us be a part of your medical care team.  It is a privilege we respect greatly.  We hope these instructions will help you stay on track for a fast and full recovery!   Increase activity slowly as tolerated    Complete by:  As directed      DISCHARGE MEDICATIONS:     Medication List     STOP taking these medications   aspirin 81 MG tablet Replaced by:  aspirin 325 MG EC tablet   HYDROcodone-acetaminophen 5-325 MG tablet Commonly known as:  NORCO/VICODIN   ibuprofen 800 MG tablet Commonly known as:  ADVIL,MOTRIN     TAKE these medications   aspirin 325 MG EC tablet Take 1 tablet (325 mg total) by mouth 2 (two) times daily. Replaces:  aspirin 81 MG tablet   citalopram 20 MG tablet Commonly known as:  CELEXA Take 20 mg by mouth daily.   donepezil 5 MG tablet Commonly known as:  ARICEPT Take 5 mg by mouth at bedtime.   furosemide 20 MG tablet Commonly known as:  LASIX Take 20 mg by mouth daily as needed for fluid.   glucosamine-chondroitin 500-400 MG tablet Take 1 tablet by mouth 3 (three) times daily as needed.   methocarbamol 500 MG tablet Commonly known as:  ROBAXIN Take 1-2 tablets (500-1,000 mg total) by mouth every 6 (six) hours as needed for muscle spasms.   oxyCODONE 5 MG immediate release tablet Commonly known as:  Oxy IR/ROXICODONE Take 1-2 tablets (5-10 mg total) by mouth every 3 (three) hours as needed for breakthrough pain.   traZODone 100 MG tablet Commonly known as:  DESYREL Take 100 mg by mouth at bedtime.   valACYclovir 1000 MG tablet Commonly known as:  VALTREX Take 1,000 mg by mouth as needed.       FOLLOW UP VISIT:    DISPOSITION: HOME VS. SNF  CONDITION:  Good   Donia Ast 10/18/2015, 12:12 PM

## 2015-11-02 ENCOUNTER — Ambulatory Visit: Payer: Medicare Other | Admitting: Physical Therapy

## 2015-11-10 ENCOUNTER — Ambulatory Visit: Payer: Medicare Other | Admitting: Physical Therapy

## 2015-11-14 ENCOUNTER — Ambulatory Visit: Payer: Medicare Other | Attending: Orthopedic Surgery | Admitting: Physical Therapy

## 2015-11-14 DIAGNOSIS — M25562 Pain in left knee: Secondary | ICD-10-CM | POA: Diagnosis present

## 2015-11-14 DIAGNOSIS — M25662 Stiffness of left knee, not elsewhere classified: Secondary | ICD-10-CM | POA: Insufficient documentation

## 2015-11-14 DIAGNOSIS — R600 Localized edema: Secondary | ICD-10-CM | POA: Insufficient documentation

## 2015-11-14 NOTE — Therapy (Signed)
[facility], [place], [address] Phone: [phone]   Fax:  [phone]  Physical Therapy Evaluation  Patient Details  Name: [name] MRN: [id number] Date of Birth: 29-May-1940 Referring Provider: [name] MD  Encounter Date: 11/14/2015      PT End of Session - 11/14/15 1231    Visit Number 1   Number of Visits 16   Date for PT Re-Evaluation 01/13/16   PT Start Time 1125   PT Stop Time 1218   PT Time Calculation (min) 53 min   Activity Tolerance Patient tolerated treatment well   Behavior During Therapy Flat affect      Past Medical History:  Diagnosis Date  . Anxiety   . Degeneration of cervical intervertebral disc   . Generalized osteoarthrosis, involving multiple sites   . Radiculopathy, cervical   . Rosacea keratitis   . Varicose veins     Past Surgical History:  Procedure Laterality Date  . APPENDECTOMY    . BACK SURGERY  1997  . BREAST SURGERY     implants , removed later  . CHOLECYSTECTOMY    . KNEE ARTHROPLASTY Right    07  . SHOULDER ARTHROSCOPY DISTAL CLAVICLE EXCISION AND OPEN ROTATOR CUFF REPAIR Left   . TONSILLECTOMY    . TOTAL KNEE ARTHROPLASTY Left 10/17/2015   Procedure: TOTAL KNEE ARTHROPLASTY;  Surgeon: [name], MD;  Location: [facility];  Service: Orthopedics;  Laterality: Left;    There were no vitals filed for this visit.       Subjective Assessment - 11/14/15 1307    Subjective The patient underwent a left total knee replacement on 10/17/15.  She had some HH PT and is very pleased with her outcome thus far.  Her CC todat is that of left knee soreness.  She is using a straight cane only as needed.   Patient Stated Goals Get out of pain.   Currently in Pain? Yes   Pain Score 8    Pain Location Knee   Pain Orientation Left   Pain Descriptors / Indicators Sore   Pain Type Surgical pain   Pain Onset 1 to 4 weeks ago   Pain Frequency Constant   Aggravating Factors   Being up.   Pain Relieving Factors Pain medication and ice.            [facility] PT Assessment - 11/14/15 0001      Assessment   Medical Diagnosis Left total knee replacement.   Referring Provider [name] MD   Onset Date/Surgical Date --  10/17/15 (surgery date).     Precautions   Precautions --  No ultrasound.     Restrictions   Weight Bearing Restrictions No     Balance Screen   Has the patient fallen in the past 6 months No   Has the patient had a decrease in activity level because of a fear of falling?  No   Is the patient reluctant to leave their home because of a fear of falling?  No     Home Environment   Living Environment Private residence     Prior Function   Level of Independence Independent     Observation/Other Assessments   Observations Left knee incisional site looks very good.     Observation/Other Assessments-Edema    Edema Circumferential     Circumferential Edema   Circumferential - Left  LT 2.5 cms > RT.     ROM / Strength  AROM / PROM / Strength AROM;Strength     AROM   Overall AROM Comments Full active left knee extension and active flexion= 100 degrees.     Strength   Overall Strength Comments Left hip and knee strength= 4+/5.     Palpation   Palpation comment Tender and "sore" diffusely over patient's left anterior knee.     Ambulation/Gait   Gait Pattern Decreased stance time - left;Antalgic   Gait Comments Patient uses a straight cane as needed.                   [facility] Adult PT Treatment/Exercise - 11/14/15 0001      Modalities   Modalities Electrical Stimulation     Electrical Stimulation   Electrical Stimulation Location Left knee.   Electrical Stimulation Action IFC at 1-10 Hz x 20 minutes.   Electrical Stimulation Goals Edema;Pain                  PT Short Term Goals - 11/14/15 1426      PT SHORT TERM GOAL #1   Title STG's=LTG's.           PT Long Term Goals - 11/14/15 1426      PT  LONG TERM GOAL #1   Title Independent with an advanced HEP.   Time 8   Period Weeks   Status New     PT LONG TERM GOAL #2   Title Active left knee flexion to 115-120 degrees+ so the patient can perform functional tasks and do so with pain not > 2-3/10.   Time 8   Period Weeks   Status New     PT LONG TERM GOAL #3   Title Perform a reciprocating stair gait with one railing with pain not > 2-3/10.   Time 8   Period Weeks   Status New     PT LONG TERM GOAL #4   Title Perform ADL's with pain not > 2-3/10.   Time 8   Period Weeks   Status New               Plan - 11/14/15 1420    Clinical Impression Statement The patient presents with a left knee pain-level of a 8/10.  She has full left knee extension and flexion to 100 degrees.  She is using a straight cane only as needed.  Her knee has some swelling and is currently very sore.   Rehab Potential Excellent   PT Frequency 2x / week   PT Duration 8 weeks   PT Treatment/Interventions ADLs/Self Care Home Management;Cryotherapy;Electrical Stimulation;Therapeutic activities;Therapeutic exercise;Patient/family education;Passive range of motion;Vasopneumatic Device   PT Next Visit Plan Total knee replacement protocol.  No ultrasound.  STW/M.      Patient will benefit from skilled therapeutic intervention in order to improve the following deficits and impairments:  Pain, Decreased activity tolerance, Decreased range of motion, Increased edema  Visit Diagnosis: Pain in left knee - Plan: PT plan of care cert/re-cert  Stiffness of left knee, not elsewhere classified - Plan: PT plan of care cert/re-cert  Localized edema - Plan: PT plan of care cert/re-cert      G-Codes - [id number]    Functional Assessment Tool Used FOTO...49% limitation.   Functional Limitation Mobility: Walking and moving around   Mobility: Walking and Moving Around Current Status [phone]) At least 40 percent but less than 60 percent impaired, limited or  restricted   Mobility: Walking and Moving Around Goal Status (  [id number]) At least 1 percent but less than 20 percent impaired, limited or restricted       Problem List Patient Active Problem List   Diagnosis Date Noted  . S/P total knee replacement 10/17/2015    Darleth Eustache, [name] 11/14/2015, 2:35 PM  [facility] [address] [place], [place], [address] Phone: [phone]   Fax:  [phone]  Name: [name] MRN: [id number] Date of Birth: 10-02-1940

## 2015-11-16 ENCOUNTER — Ambulatory Visit: Payer: Medicare Other | Admitting: Physical Therapy

## 2015-11-16 ENCOUNTER — Encounter: Payer: Self-pay | Admitting: Physical Therapy

## 2015-11-16 DIAGNOSIS — M25562 Pain in left knee: Secondary | ICD-10-CM | POA: Diagnosis not present

## 2015-11-16 DIAGNOSIS — R600 Localized edema: Secondary | ICD-10-CM

## 2015-11-16 DIAGNOSIS — M25662 Stiffness of left knee, not elsewhere classified: Secondary | ICD-10-CM

## 2015-11-16 NOTE — Therapy (Signed)
[facility] [place], [place], [address] Phone: [phone]   Fax:  [phone]  Physical Therapy Treatment  Patient Details  Name: [name] MRN: [id number] Date of Birth: Jul 27, 1940 Referring Provider: [name] MD  Encounter Date: 11/16/2015      PT End of Session - 11/16/15 1347    Visit Number 2   Number of Visits 16   Date for PT Re-Evaluation 01/13/16   PT Start Time [id number]   PT Stop Time 1359   PT Time Calculation (min) 44 min   Activity Tolerance Patient tolerated treatment well   Behavior During Therapy [facility] for tasks assessed/performed      Past Medical History:  Diagnosis Date  . Anxiety   . Degeneration of cervical intervertebral disc   . Generalized osteoarthrosis, involving multiple sites   . Radiculopathy, cervical   . Rosacea keratitis   . Varicose veins     Past Surgical History:  Procedure Laterality Date  . APPENDECTOMY    . BACK SURGERY  1997  . BREAST SURGERY     implants , removed later  . CHOLECYSTECTOMY    . KNEE ARTHROPLASTY Right    07  . SHOULDER ARTHROSCOPY DISTAL CLAVICLE EXCISION AND OPEN ROTATOR CUFF REPAIR Left   . TONSILLECTOMY    . TOTAL KNEE ARTHROPLASTY Left 10/17/2015   Procedure: TOTAL KNEE ARTHROPLASTY;  Surgeon: [name], MD;  Location: [place];  Service: Orthopedics;  Laterality: Left;    There were no vitals filed for this visit.      Subjective Assessment - 11/16/15 1327    Subjective Patient reports soreness in knee today   Patient Stated Goals Get out of pain.   Currently in Pain? Yes   Pain Score 5    Pain Location Knee   Pain Orientation Left   Pain Descriptors / Indicators Sore   Pain Type Surgical pain   Pain Onset 1 to 4 weeks ago   Pain Frequency Constant   Aggravating Factors  prolong activity   Pain Relieving Factors at rest            [facility] PT Assessment - 11/16/15 0001      ROM / Strength   AROM / PROM / Strength AROM;PROM      AROM   AROM Assessment Site Knee   Right/Left Knee Left   Left Knee Flexion 100     PROM   PROM Assessment Site Knee   Right/Left Knee Left   Left Knee Flexion 110                     [facility] Adult PT Treatment/Exercise - 11/16/15 0001      Exercises   Exercises Knee/Hip     Knee/Hip Exercises: Stretches   Knee: Self-Stretch to increase Flexion 30 seconds;3 reps     Knee/Hip Exercises: Aerobic   Nustep L3 x63min     Knee/Hip Exercises: Standing   Rocker Board 2 minutes     Modalities   Modalities Electrical Stimulation;Vasopneumatic     Electrical Stimulation   Electrical Stimulation Location Left knee.   Chartered certified accountant IFC   Electrical Stimulation Parameters 1-10hz  x12min   Electrical Stimulation Goals Edema;Pain     Vasopneumatic   Number Minutes Vasopneumatic  15 minutes   Vasopnuematic Location  Knee   Vasopneumatic Pressure Medium     Manual Therapy   Manual Therapy Passive ROM   Passive ROM gentle PROM for left knee  flexion with low load holds                  PT Short Term Goals - 11/14/15 1426      PT SHORT TERM GOAL #1   Title STG's=LTG's.           PT Long Term Goals - 11/16/15 1348      PT LONG TERM GOAL #1   Title Independent with an advanced HEP.   Time 8   Period Weeks   Status On-going     PT LONG TERM GOAL #2   Title Active left knee flexion to 115-120 degrees+ so the patient can perform functional tasks and do so with pain not > 2-3/10.   Time 8   Period Weeks   Status On-going     PT LONG TERM GOAL #3   Title Perform a reciprocating stair gait with one railing with pain not > 2-3/10.   Time 8   Period Weeks   Status On-going     PT LONG TERM GOAL #4   Title Perform ADL's with pain not > 2-3/10.   Time 8   Period Weeks   Status On-going               Plan - 11/16/15 1348    Clinical Impression Statement Patient progressing with all activities today. Patient tolerated  treatment well. Patient had less pain reported. Patient improved left knee flexion and progressing toward goals. Current goals ongoing due to ROM, strength and pain deficits.   Rehab Potential Excellent   PT Frequency 2x / week   PT Duration 8 weeks   PT Treatment/Interventions ADLs/Self Care Home Management;Cryotherapy;Electrical Stimulation;Therapeutic activities;Therapeutic exercise;Patient/family education;Passive range of motion;Vasopneumatic Device   PT Next Visit Plan Total knee replacement protocol.  No ultrasound.  STW/M.   Consulted and Agree with Plan of Care Patient      Patient will benefit from skilled therapeutic intervention in order to improve the following deficits and impairments:  Pain, Decreased activity tolerance, Decreased range of motion, Increased edema  Visit Diagnosis: Pain in left knee  Stiffness of left knee, not elsewhere classified  Localized edema     Problem List Patient Active Problem List   Diagnosis Date Noted  . S/P total knee replacement 10/17/2015    Corina Stacy P, PTA 11/16/2015, 2:00 PM  [facility] [place], [place], [address] Phone: [phone]   Fax:  [phone]  Name: [name] MRN: [id number] Date of Birth: 26-Apr-1940

## 2015-11-21 ENCOUNTER — Ambulatory Visit: Payer: Medicare Other | Admitting: Physical Therapy

## 2015-11-21 DIAGNOSIS — R600 Localized edema: Secondary | ICD-10-CM

## 2015-11-21 DIAGNOSIS — M25562 Pain in left knee: Secondary | ICD-10-CM | POA: Diagnosis not present

## 2015-11-21 DIAGNOSIS — M25662 Stiffness of left knee, not elsewhere classified: Secondary | ICD-10-CM

## 2015-11-21 NOTE — Therapy (Signed)
[name], [place], [address] Phone: [phone]   Fax:  [phone]  Physical Therapy Treatment  Patient Details  Name: [name] MRN: [id number] Date of Birth: [birth year] Referring Provider: [name] MD  Encounter Date: 11/21/2015      PT End of Session - 11/21/15 1227    Visit Number 3   Number of Visits 16   Date for PT Re-Evaluation 01/13/16   PT Start Time 0945   PT Stop Time 1031   PT Time Calculation (min) 46 min      Past Medical History:  Diagnosis Date  . Anxiety   . Degeneration of cervical intervertebral disc   . Generalized osteoarthrosis, involving multiple sites   . Radiculopathy, cervical   . Rosacea keratitis   . Varicose veins     Past Surgical History:  Procedure Laterality Date  . APPENDECTOMY    . BACK SURGERY  1997  . BREAST SURGERY     implants , removed later  . CHOLECYSTECTOMY    . KNEE ARTHROPLASTY Right    07  . SHOULDER ARTHROSCOPY DISTAL CLAVICLE EXCISION AND OPEN ROTATOR CUFF REPAIR Left   . TONSILLECTOMY    . TOTAL KNEE ARTHROPLASTY Left 10/17/2015   Procedure: TOTAL KNEE ARTHROPLASTY;  Surgeon: [name], MD;  Location: [place];  Service: Orthopedics;  Laterality: Left;    There were no vitals filed for this visit.      Subjective Assessment - 11/21/15 1228    Subjective My knee is very sore.   Patient Stated Goals Get out of pain.   Pain Score 6    Pain Location Knee   Pain Orientation Left   Pain Descriptors / Indicators Sore   Pain Type Surgical pain   Pain Onset 1 to 4 weeks ago                         [facility] Adult PT Treatment/Exercise - 11/21/15 0001      Exercises   Exercises Knee/Hip;Ankle     Knee/Hip Exercises: Aerobic   Nustep Level 5 x 15 minutes.     Acupuncturist Location Left knee.   Electrical Stimulation Action IFC x 15 minutes.   Electrical Stimulation Goals Pain     Vasopneumatic   Number Minutes Vasopneumatic  15 minutes   Vasopnuematic Location  --  Left knee.   Vasopneumatic Pressure Medium     Manual Therapy   Manual therapy comments STW/M to left distal ITB which was patient's chief area of palpable pain x 5 minutes.     Ankle Exercises: Standing   Rocker Board Limitations 5 minutes.                  PT Short Term Goals - 11/14/15 1426      PT SHORT TERM GOAL #1   Title STG's=LTG's.           PT Long Term Goals - 11/16/15 1348      PT LONG TERM GOAL #1   Title Independent with an advanced HEP.   Time 8   Period Weeks   Status On-going     PT LONG TERM GOAL #2   Title Active left knee flexion to 115-120 degrees+ so the patient can perform functional tasks and do so with pain not > 2-3/10.   Time 8   Period Weeks   Status On-going  PT LONG TERM GOAL #3   Title Perform a reciprocating stair gait with one railing with pain not > 2-3/10.   Time 8   Period Weeks   Status On-going     PT LONG TERM GOAL #4   Title Perform ADL's with pain not > 2-3/10.   Time 8   Period Weeks   Status On-going             Patient will benefit from skilled therapeutic intervention in order to improve the following deficits and impairments:  Pain, Decreased activity tolerance, Decreased range of motion, Increased edema  Visit Diagnosis: Pain in left knee  Stiffness of left knee, not elsewhere classified  Localized edema     Problem List Patient Active Problem List   Diagnosis Date Noted  . S/P total knee replacement 10/17/2015    [name], [name] 11/21/2015, 12:32 PM  [facility] [address] [place], [place], [address] Phone: [phone]   Fax:  [phone]  Name: [name] MRN: [id number] Date of Birth: [birth year]

## 2015-11-24 ENCOUNTER — Encounter: Payer: Self-pay | Admitting: Physical Therapy

## 2015-11-24 ENCOUNTER — Ambulatory Visit: Payer: Medicare Other | Admitting: Physical Therapy

## 2015-11-24 DIAGNOSIS — R600 Localized edema: Secondary | ICD-10-CM

## 2015-11-24 DIAGNOSIS — M25562 Pain in left knee: Secondary | ICD-10-CM

## 2015-11-24 DIAGNOSIS — M25662 Stiffness of left knee, not elsewhere classified: Secondary | ICD-10-CM

## 2015-11-24 NOTE — Therapy (Signed)
[facility] [place], [place], [address] Phone: [phone]   Fax:  [phone]  Physical Therapy Treatment  Patient Details  Name: [name] MRN: [id number] Date of Birth: 1940-05-10 Referring Provider: [name] MD  Encounter Date: 11/24/2015      PT End of Session - 11/24/15 1115    Visit Number 4   Number of Visits 16   Date for PT Re-Evaluation 01/13/16   PT Start Time 1030   PT Stop Time 1132   PT Time Calculation (min) 62 min   Activity Tolerance Patient tolerated treatment well   Behavior During Therapy [facility] for tasks assessed/performed      Past Medical History:  Diagnosis Date  . Anxiety   . Degeneration of cervical intervertebral disc   . Generalized osteoarthrosis, involving multiple sites   . Radiculopathy, cervical   . Rosacea keratitis   . Varicose veins     Past Surgical History:  Procedure Laterality Date  . APPENDECTOMY    . BACK SURGERY  1997  . BREAST SURGERY     implants , removed later  . CHOLECYSTECTOMY    . KNEE ARTHROPLASTY Right    07  . SHOULDER ARTHROSCOPY DISTAL CLAVICLE EXCISION AND OPEN ROTATOR CUFF REPAIR Left   . TONSILLECTOMY    . TOTAL KNEE ARTHROPLASTY Left 10/17/2015   Procedure: TOTAL KNEE ARTHROPLASTY;  Surgeon: [name], MD;  Location: [place];  Service: Orthopedics;  Laterality: Left;    There were no vitals filed for this visit.      Subjective Assessment - 11/24/15 1041    Subjective Patient reported more ache than sore in knee and would like to progress with all exercises and weights to strengthen knee. Right knee also hurts per patient.   Patient Stated Goals Get out of pain.   Currently in Pain? Yes   Pain Score 3    Pain Location Knee   Pain Orientation Left   Pain Descriptors / Indicators Aching   Pain Type Surgical pain   Pain Onset 1 to 4 weeks ago   Pain Frequency Constant   Aggravating Factors  prolong activity   Pain Relieving Factors  at rest                         [facility] Adult PT Treatment/Exercise - 11/24/15 0001      Knee/Hip Exercises: Aerobic   Nustep L5x48mn UE/LE activity     Knee/Hip Exercises: Machines for Strengthening   Cybex Knee Extension 10# 3x10   Cybex Knee Flexion 30# 3x10     Knee/Hip Exercises: Standing   Hip Abduction Stengthening;Left;2 sets;15 reps;Knee straight  with green t-band   Lateral Step Up Left;15 reps;Hand Hold: 2;Step Height: 6"   Forward Step Up Left;3 sets;10 reps;Hand Hold: 1;Step Height: 6"   Step Down Left;Hand Hold: 2;10 reps;Step Height: 6"     Knee/Hip Exercises: Seated   Sit to Sand 20 reps;without UE support     Knee/Hip Exercises: Supine   Bridges Limitations unable to perform due to cramp   Straight Leg Raises Strengthening;Left  x 8 with 2#   Straight Leg Raises Limitations cramp after 8     Knee/Hip Exercises: Sidelying   Hip ABduction AROM;Strengthening;Left;10 reps  2#     Electrical Stimulation   Electrical Stimulation Location Left knee.   Electrical Stimulation Action IFC   Electrical Stimulation Parameters 1-10HZ  x122m   Electrical Stimulation Goals Pain  Vasopneumatic   Number Minutes Vasopneumatic  15 minutes   Vasopnuematic Location  Knee   Vasopneumatic Pressure Medium                  PT Short Term Goals - 11/14/15 1426      PT SHORT TERM GOAL #1   Title STG's=LTG's.           PT Long Term Goals - 11/24/15 1109      PT LONG TERM GOAL #1   Title Independent with an advanced HEP.   Time 8   Period Weeks   Status On-going     PT LONG TERM GOAL #2   Title Active left knee flexion to 115-120 degrees+ so the patient can perform functional tasks and do so with pain not > 2-3/10.   Time 8   Period Weeks   Status Achieved  AROM 116 degrees 11/24/15     PT LONG TERM GOAL #3   Title Perform a reciprocating stair gait with one railing with pain not > 2-3/10.   Time 8   Period Weeks   Status  On-going     PT LONG TERM GOAL #4   Title Perform ADL's with pain not > 2-3/10.   Time 8   Period Weeks   Status On-going               Plan - 11/24/15 1116    Clinical Impression Statement Patient progressing with all activities today and had no reports of pain, only limitation with some exercises was a cramp in right leg. Patient tolerated well and would like to continue to progress with strengtheing. Patient reported doing her husbands weight machines at home daily. Patient improved with AROM for left knee fleion to 116 degrees. Patient met LTG #2 other goals ongoing due to pain and strength deficits.   Rehab Potential Excellent   PT Frequency 2x / week   PT Duration 8 weeks   PT Treatment/Interventions ADLs/Self Care Home Management;Cryotherapy;Electrical Stimulation;Therapeutic activities;Therapeutic exercise;Patient/family education;Passive range of motion;Vasopneumatic Device   PT Next Visit Plan cont to progress with strengthening and modalities PRN   Consulted and Agree with Plan of Care Patient      Patient will benefit from skilled therapeutic intervention in order to improve the following deficits and impairments:  Pain, Decreased activity tolerance, Decreased range of motion, Increased edema  Visit Diagnosis: Pain in left knee  Stiffness of left knee, not elsewhere classified  Localized edema     Problem List Patient Active Problem List   Diagnosis Date Noted  . S/P total knee replacement 10/17/2015    [name] P, PTA 11/24/2015, 11:33 AM  [facility] [place], [place], [address] Phone: [phone]   Fax:  [phone]  Name: [name] MRN: [id number] Date of Birth: 04/07/1940

## 2015-11-28 ENCOUNTER — Ambulatory Visit: Payer: Medicare Other | Admitting: Physical Therapy

## 2015-11-28 ENCOUNTER — Encounter: Payer: Self-pay | Admitting: Physical Therapy

## 2015-11-28 DIAGNOSIS — M25562 Pain in left knee: Secondary | ICD-10-CM | POA: Diagnosis not present

## 2015-11-28 DIAGNOSIS — M25662 Stiffness of left knee, not elsewhere classified: Secondary | ICD-10-CM

## 2015-11-28 DIAGNOSIS — R600 Localized edema: Secondary | ICD-10-CM

## 2015-11-28 NOTE — Therapy (Addendum)
[facility] [place], [place], [address] Phone: [phone]   Fax:  [phone]  Physical Therapy Treatment  Patient Details  Name: [name] MRN: [id number] Date of Birth: May 10, 1940 Referring Provider: [name] MD  Encounter Date: 11/28/2015      PT End of Session - 11/28/15 1019    Visit Number 5   Number of Visits 16   Date for PT Re-Evaluation 01/13/16   PT Start Time 0946   PT Stop Time 1033   PT Time Calculation (min) 47 min   Activity Tolerance Patient tolerated treatment well   Behavior During Therapy [facility] for tasks assessed/performed      Past Medical History:  Diagnosis Date  . Anxiety   . Degeneration of cervical intervertebral disc   . Generalized osteoarthrosis, involving multiple sites   . Radiculopathy, cervical   . Rosacea keratitis   . Varicose veins     Past Surgical History:  Procedure Laterality Date  . APPENDECTOMY    . BACK SURGERY  1997  . BREAST SURGERY     implants , removed later  . CHOLECYSTECTOMY    . KNEE ARTHROPLASTY Right    07  . SHOULDER ARTHROSCOPY DISTAL CLAVICLE EXCISION AND OPEN ROTATOR CUFF REPAIR Left   . TONSILLECTOMY    . TOTAL KNEE ARTHROPLASTY Left 10/17/2015   Procedure: TOTAL KNEE ARTHROPLASTY;  Surgeon: [name], MD;  Location: [place];  Service: Orthopedics;  Laterality: Left;    There were no vitals filed for this visit.      Subjective Assessment - 11/28/15 0946    Subjective Patient reported a burning sensation in knee cap after a long drive in car to [facility]   Patient Stated Goals Get out of pain.   Currently in Pain? Yes   Pain Score 4    Pain Location Knee   Pain Orientation Left   Pain Descriptors / Indicators Burning;Sore   Pain Type Surgical pain   Pain Onset 1 to 4 weeks ago   Pain Frequency Constant   Aggravating Factors  prolong activity and prolong siting in car   Pain Relieving Factors at rest                          [facility] Adult PT Treatment/Exercise - 11/28/15 0001      Knee/Hip Exercises: Aerobic   Nustep L5x12 min UE/LE activity     Knee/Hip Exercises: Machines for Strengthening   Cybex Knee Extension 10# 3x10   Cybex Knee Flexion 30# 3x10     Knee/Hip Exercises: Standing   Hip Abduction Stengthening;Left;2 sets;15 reps;Knee straight  with grren t-band   Lateral Step Up Left;2 sets;10 reps;Hand Hold: 1;Step Height: 6"   Forward Step Up Left;3 sets;10 reps;Hand Hold: 1;Step Height: 6"     Electrical Stimulation   Electrical Stimulation Location Left knee.   Electrical Stimulation Action IFC   Electrical Stimulation Parameters 1-_0  x73mn   Electrical Stimulation Goals Pain     Vasopneumatic   Number Minutes Vasopneumatic  15 minutes   Vasopnuematic Location  Knee   Vasopneumatic Pressure Medium                  PT Short Term Goals - 11/14/15 1426      PT SHORT TERM GOAL #1   Title STG's=LTG's.           PT Long Term Goals - 11/24/15 1109  PT LONG TERM GOAL #1   Title Independent with an advanced HEP.   Time 8   Period Weeks   Status On-going     PT LONG TERM GOAL #2   Title Active left knee flexion to 115-120 degrees+ so the patient can perform functional tasks and do so with pain not > 2-3/10.   Time 8   Period Weeks   Status Achieved  AROM 116 degrees 11/24/15     PT LONG TERM GOAL #3   Title Perform a reciprocating stair gait with one railing with pain not > 2-3/10.   Time 8   Period Weeks   Status On-going     PT LONG TERM GOAL #4   Title Perform ADL's with pain not > 2-3/10.   Time 8   Period Weeks   Status On-going               Plan - 11/28/15 1020    Clinical Impression Statement Patient progressing with all activities today. Patient has some soreness after being in a car per a few hours driving. Patient wanted to shorted exersises due to soreness today. Patient continues to have full AROM  in left knee. Patient progressing toward goals yet ongoing due to paiun and strength defict.   Rehab Potential Excellent   PT Frequency 2x / week   PT Duration 8 weeks   PT Treatment/Interventions ADLs/Self Care Home Management;Cryotherapy;Electrical Stimulation;Therapeutic activities;Therapeutic exercise;Patient/family education;Passive range of motion;Vasopneumatic Device   PT Next Visit Plan cont to progress with strengthening and modalities PRN   Consulted and Agree with Plan of Care Patient      Patient will benefit from skilled therapeutic intervention in order to improve the following deficits and impairments:  Pain, Decreased activity tolerance, Decreased range of motion, Increased edema  Visit Diagnosis: Pain in left knee  Stiffness of left knee, not elsewhere classified  Localized edema     Problem List Patient Active Problem List   Diagnosis Date Noted  . S/P total knee replacement 10/17/2015    Hanifa Antonetti P, PTA 11/28/2015, 10:33 AM  [facility] [place], [place], [address] Phone: [phone]   Fax:  [phone]  Name: [name] MRN: [id number] Date of Birth: [birth year]  PHYSICAL THERAPY DISCHARGE SUMMARY  Visits from Start of Care: 5.  Current functional level related to goals / functional outcomes: See above.   Remaining deficits: See goal section.   Education / Equipment: HEP. Plan: Patient agrees to discharge.  Patient goals were partially met. Patient is being discharged due to not returning since the last visit.  ?????          Mali Applegate MPT

## 2015-12-01 ENCOUNTER — Ambulatory Visit: Payer: Medicare Other | Admitting: Physical Therapy

## 2015-12-02 ENCOUNTER — Other Ambulatory Visit: Payer: Self-pay | Admitting: *Deleted

## 2015-12-05 ENCOUNTER — Encounter: Payer: Medicare Other | Admitting: Physical Therapy

## 2015-12-06 ENCOUNTER — Other Ambulatory Visit: Payer: Self-pay | Admitting: *Deleted

## 2015-12-08 ENCOUNTER — Other Ambulatory Visit: Payer: Self-pay | Admitting: Physician Assistant

## 2015-12-08 ENCOUNTER — Encounter: Payer: Medicare Other | Admitting: Physical Therapy

## 2015-12-28 ENCOUNTER — Other Ambulatory Visit: Payer: Self-pay | Admitting: Physician Assistant

## 2016-03-13 DIAGNOSIS — Z471 Aftercare following joint replacement surgery: Secondary | ICD-10-CM | POA: Diagnosis not present

## 2016-03-13 DIAGNOSIS — G8929 Other chronic pain: Secondary | ICD-10-CM | POA: Diagnosis not present

## 2016-03-13 DIAGNOSIS — T84032A Mechanical loosening of internal right knee prosthetic joint, initial encounter: Secondary | ICD-10-CM | POA: Diagnosis not present

## 2016-03-13 DIAGNOSIS — Z96651 Presence of right artificial knee joint: Secondary | ICD-10-CM | POA: Diagnosis not present

## 2016-03-15 ENCOUNTER — Other Ambulatory Visit: Payer: Self-pay | Admitting: Physician Assistant

## 2016-03-16 ENCOUNTER — Encounter: Payer: Self-pay | Admitting: Physician Assistant

## 2016-03-16 ENCOUNTER — Ambulatory Visit (INDEPENDENT_AMBULATORY_CARE_PROVIDER_SITE_OTHER): Payer: Medicare Other | Admitting: Physician Assistant

## 2016-03-16 ENCOUNTER — Telehealth: Payer: Self-pay | Admitting: Physician Assistant

## 2016-03-16 VITALS — BP 121/73 | HR 78 | Temp 97.1°F | Ht 60.0 in | Wt 209.8 lb

## 2016-03-16 DIAGNOSIS — N3281 Overactive bladder: Secondary | ICD-10-CM | POA: Diagnosis not present

## 2016-03-16 DIAGNOSIS — R739 Hyperglycemia, unspecified: Secondary | ICD-10-CM | POA: Diagnosis not present

## 2016-03-16 DIAGNOSIS — M1711 Unilateral primary osteoarthritis, right knee: Secondary | ICD-10-CM | POA: Diagnosis not present

## 2016-03-16 DIAGNOSIS — Z23 Encounter for immunization: Secondary | ICD-10-CM | POA: Diagnosis not present

## 2016-03-16 DIAGNOSIS — Z0000 Encounter for general adult medical examination without abnormal findings: Secondary | ICD-10-CM | POA: Diagnosis not present

## 2016-03-16 DIAGNOSIS — Z136 Encounter for screening for cardiovascular disorders: Secondary | ICD-10-CM | POA: Diagnosis not present

## 2016-03-16 LAB — BAYER DCA HB A1C WAIVED: HB A1C (BAYER DCA - WAIVED): 5.7 % (ref <7.0)

## 2016-03-16 MED ORDER — TOLTERODINE TARTRATE ER 4 MG PO CP24: 4.0000 mg | ORAL_CAPSULE | Freq: Every day | ORAL | 6 refills | Status: DC

## 2016-03-16 MED ORDER — HYDROCODONE-ACETAMINOPHEN 5-325 MG PO TABS: 1.0000 | ORAL_TABLET | Freq: Four times a day (QID) | ORAL | 0 refills | Status: DC | PRN

## 2016-03-16 MED ORDER — OXYBUTYNIN CHLORIDE ER 10 MG PO TB24: 10.0000 mg | ORAL_TABLET | Freq: Every day | ORAL | 6 refills | Status: DC

## 2016-03-16 NOTE — Patient Instructions (Signed)
Arthritis Introduction Arthritis means joint pain. It can also mean joint disease. A joint is a place where bones come together. People who have arthritis may have:  Red joints.  Swollen joints.  Stiff joints.  Warm joints.  A fever.  A feeling of being sick. Follow these instructions at home: Pay attention to any changes in your symptoms. Take these actions to help with your pain and swelling. Medicines  Take over-the-counter and prescription medicines only as told by your doctor.  Do not take aspirin for pain if your doctor says that you may have gout. Activity  Rest your joint if your doctor tells you to.  Avoid activities that make the pain worse.  Exercise your joint regularly as told by your doctor. Try doing exercises like:  Swimming.  Water aerobics.  Biking.  Walking. Joint Care   If your joint is swollen, keep it raised (elevated) if told by your doctor.  If your joint feels stiff in the morning, try taking a warm shower.  If you have diabetes, do not apply heat without asking your doctor.  If told, apply heat to the joint:  Put a towel between the joint and the hot pack or heating pad.  Leave the heat on the area for 20-30 minutes.  If told, apply ice to the joint:  Put ice in a plastic bag.  Place a towel between your skin and the bag.  Leave the ice on for 20 minutes, 2-3 times per day.  Keep all follow-up visits as told by your doctor. Contact a doctor if:  The pain gets worse.  You have a fever. Get help right away if:  You have very bad pain in your joint.  You have swelling in your joint.  Your joint is red.  Many joints become painful and swollen.  You have very bad back pain.  Your leg is very weak.  You cannot control your pee (urine) or poop (stool). This information is not intended to replace advice given to you by your health care provider. Make sure you discuss any questions you have with your health care  provider. Document Released: 05/16/2009 Document Revised: 07/28/2015 Document Reviewed: 05/17/2014  2017 [employer]  

## 2016-03-16 NOTE — Telephone Encounter (Signed)
I will send new rx

## 2016-03-16 NOTE — Progress Notes (Signed)
BP 121/73   Pulse 78   Temp 97.1 F (36.2 C) (Oral)   Ht 5' (1.524 m)   Wt 209 lb 12.8 oz (95.2 kg)   BMI 40.97 kg/m    Subjective:    Patient ID: [name], female    DOB: [birth year], 76 y.o.   MRN: [id number]  [name] is a 76 y.o. female presenting on 03/16/2016 for Establish Care and Surgical clearance (Having knee surgery )  HPI Patient here to be established as new patient at [facility].  This patient is known to me from [facility]. This patient comes in for periodic recheck on medications and conditions. All medications are reviewed today. There are no reports of any problems with the medications. All of the medical conditions are reviewed and updated.  Lab work is reviewed and will be ordered as medically necessary. There are no new problems reported with today's visit.  Will be having right knee total replacement. Knee is locking in full flexion and extension positions.  Has already seen her orthopedist.  Past Medical History:  Diagnosis Date  . Anxiety   . Degeneration of cervical intervertebral disc   . Generalized osteoarthrosis, involving multiple sites   . Radiculopathy, cervical   . Rosacea keratitis   . Varicose veins    Relevant past medical, surgical, family and social history reviewed and updated as indicated. Interim medical history since our last visit reviewed. Allergies and medications reviewed and updated.   Data reviewed from any sources in EPIC.  Review of Systems  Constitutional: Negative.  Negative for activity change, fatigue and fever.  HENT: Negative.   Eyes: Negative.   Respiratory: Negative.  Negative for cough.   Cardiovascular: Negative.  Negative for chest pain.  Gastrointestinal: Negative.  Negative for abdominal pain.  Endocrine: Negative.   Genitourinary: Negative.  Negative for dysuria.  Musculoskeletal: Positive for arthralgias, back pain and joint swelling.  Skin: Negative.     Neurological: Negative.      Social History   Social History  . Marital status: Married    Spouse name: N/A  . Number of children: N/A  . Years of education: N/A   Occupational History  . Not on file.   Social History Main Topics  . Smoking status: Never Smoker  . Smokeless tobacco: Never Used  . Alcohol use Yes     Comment: occ  . Drug use: No  . Sexual activity: Not on file   Other Topics Concern  . Not on file   Social History Narrative  . No narrative on file    Past Surgical History:  Procedure Laterality Date  . APPENDECTOMY    . BACK SURGERY  1997  . BREAST SURGERY     implants , removed later  . CHOLECYSTECTOMY    . KNEE ARTHROPLASTY Right    07  . SHOULDER ARTHROSCOPY DISTAL CLAVICLE EXCISION AND OPEN ROTATOR CUFF REPAIR Left   . TONSILLECTOMY    . TOTAL KNEE ARTHROPLASTY Left 10/17/2015   Procedure: TOTAL KNEE ARTHROPLASTY;  Surgeon: [name], MD;  Location: [facility];  Service: Orthopedics;  Laterality: Left;    Family History  Problem Relation Age of Onset  . Heart disease Mother   . Heart disease Father     Allergies as of 03/16/2016      Reactions   Acetaminophen Other (See Comments)   Tylenol with codeine only - GI UPSET   Amoxicillin Rash   Ampicillin Rash  Codeine Phosphate Nausea Only      Medication List       Accurate as of 03/16/16 11:59 PM. Always use your most recent med list.          aspirin 325 MG EC tablet Take 1 tablet (325 mg total) by mouth 2 (two) times daily.   citalopram 20 MG tablet Commonly known as:  CELEXA Take 20 mg by mouth daily.   donepezil 5 MG tablet Commonly known as:  ARICEPT TAKE ONE TABLET AT BEDTIME   HYDROcodone-acetaminophen 5-325 MG tablet Commonly known as:  NORCO/VICODIN Take 1 tablet by mouth every 6 (six) hours as needed.   HYDROcodone-acetaminophen 5-325 MG tablet Commonly known as:  NORCO/VICODIN Take 1 tablet by mouth every 6 (six) hours as needed for moderate pain.    oxybutynin 10 MG 24 hr tablet Commonly known as:  DITROPAN-XL Take 1 tablet (10 mg total) by mouth at bedtime.   traZODone 100 MG tablet Commonly known as:  DESYREL Take 100 mg by mouth at bedtime.          Objective:    BP 121/73   Pulse 78   Temp 97.1 F (36.2 C) (Oral)   Ht 5' (1.524 m)   Wt 209 lb 12.8 oz (95.2 kg)   BMI 40.97 kg/m   Allergies  Allergen Reactions  . Acetaminophen Other (See Comments)    Tylenol with codeine only - GI UPSET  . Amoxicillin Rash  . Ampicillin Rash  . Codeine Phosphate Nausea Only   Wt Readings from Last 3 Encounters:  03/16/16 209 lb 12.8 oz (95.2 kg)  10/17/15 217 lb (98.4 kg)  10/07/15 217 lb 6.4 oz (98.6 kg)    Physical Exam  Constitutional: She is oriented to person, place, and time. She appears well-developed and well-nourished.  HENT:  Head: Normocephalic and atraumatic.  Right Ear: Tympanic membrane, external ear and ear canal normal.  Left Ear: Tympanic membrane, external ear and ear canal normal.  Nose: Nose normal. No rhinorrhea.  Mouth/Throat: Oropharynx is clear and moist and mucous membranes are normal. No oropharyngeal exudate or posterior oropharyngeal erythema.  Eyes: Conjunctivae and EOM are normal. Pupils are equal, round, and reactive to light.  Neck: Normal range of motion. Neck supple.  Cardiovascular: Normal rate, regular rhythm, normal heart sounds and intact distal pulses.   Pulmonary/Chest: Effort normal and breath sounds normal.  Abdominal: Soft. Bowel sounds are normal.  Musculoskeletal:       Right knee: She exhibits decreased range of motion, swelling and deformity.       Left knee: She exhibits swelling. No tenderness found.       Legs: Neurological: She is alert and oriented to person, place, and time. She has normal reflexes.  Skin: Skin is warm and dry. No rash noted.  Psychiatric: She has a normal mood and affect. Her behavior is normal. Judgment and thought content normal.    Results for  orders placed or performed in visit on 03/16/16  Bayer DCA Hb A1c Waived  Result Value Ref Range   Bayer DCA Hb A1c Waived 5.7 <7.0 %  CBC with Differential/Platelet  Result Value Ref Range   WBC 5.5 3.4 - 10.8 x10E3/uL   RBC 4.55 3.77 - 5.28 x10E6/uL   Hemoglobin 14.0 11.1 - 15.9 g/dL   Hematocrit 42.3 34.0 - 46.6 %   MCV 93 79 - 97 fL   MCH 30.8 26.6 - 33.0 pg   MCHC 33.1 31.5 - 35.7 g/dL  RDW 14.1 12.3 - 15.4 %   Platelets 231 150 - 379 x10E3/uL   Neutrophils 49 Not Estab. %   Lymphs 35 Not Estab. %   Monocytes 13 Not Estab. %   Eos 3 Not Estab. %   Basos 0 Not Estab. %   Neutrophils Absolute 2.7 1.4 - 7.0 x10E3/uL   Lymphocytes Absolute 1.9 0.7 - 3.1 x10E3/uL   Monocytes Absolute 0.7 0.1 - 0.9 x10E3/uL   EOS (ABSOLUTE) 0.1 0.0 - 0.4 x10E3/uL   Basophils Absolute 0.0 0.0 - 0.2 x10E3/uL   Immature Granulocytes 0 Not Estab. %   Immature Grans (Abs) 0.0 0.0 - 0.1 x10E3/uL  Lipid panel  Result Value Ref Range   Cholesterol, Total 221 (H) 100 - 199 mg/dL   Triglycerides 126 0 - 149 mg/dL   HDL 54 >39 mg/dL   VLDL Cholesterol Cal 25 5 - 40 mg/dL   LDL Calculated 142 (H) 0 - 99 mg/dL   Chol/HDL Ratio 4.1 0.0 - 4.4 ratio units  CMP14+EGFR  Result Value Ref Range   Glucose 91 65 - 99 mg/dL   BUN 16 8 - 27 mg/dL   Creatinine, Ser 0.81 0.57 - 1.00 mg/dL   GFR calc non Af Amer 70 >59 mL/min/1.73   GFR calc Af Amer 81 >59 mL/min/1.73   BUN/Creatinine Ratio 20 12 - 28   Sodium 140 134 - 144 mmol/L   Potassium 4.4 3.5 - 5.2 mmol/L   Chloride 101 96 - 106 mmol/L   CO2 23 18 - 29 mmol/L   Calcium 9.3 8.7 - 10.3 mg/dL   Total Protein 6.6 6.0 - 8.5 g/dL   Albumin 4.5 3.5 - 4.8 g/dL   Globulin, Total 2.1 1.5 - 4.5 g/dL   Albumin/Globulin Ratio 2.1 1.2 - 2.2   Bilirubin Total 0.7 0.0 - 1.2 mg/dL   Alkaline Phosphatase 74 39 - 117 IU/L   AST 16 0 - 40 IU/L   ALT 16 0 - 32 IU/L      Assessment & Plan:   1. Hyperglycemia - Bayer DCA Hb A1c Waived  2. Primary osteoarthritis  of right knee - HYDROcodone-acetaminophen (NORCO/VICODIN) 5-325 MG tablet; Take 1 tablet by mouth every 6 (six) hours as needed.  Dispense: 120 tablet; Refill: 0 - HYDROcodone-acetaminophen (NORCO/VICODIN) 5-325 MG tablet; Take 1 tablet by mouth every 6 (six) hours as needed for moderate pain.  Dispense: 120 tablet; Refill: 0  3. Well adolescent visit - CBC with Differential/Platelet - Lipid panel - CMP14+EGFR  4. OAB (overactive bladder) - oxybutynin (DITROPAN-XL) 10 MG 24 hr tablet; Take 1 tablet (10 mg total) by mouth at bedtime.  Dispense: 30 tablet; Refill: 6   Continue all other maintenance medications as listed above. Educational handout given for health maintenance  Follow up plan: Return in about 4 months (around 07/14/2016) for recheck.  Terald Sleeper PA-C [facility] [address]  [place], [place] [address] [phone]   03/18/2016, 6:22 PM

## 2016-03-16 NOTE — Telephone Encounter (Signed)
Detrol is not preferred but generic ditropan is can we please change

## 2016-03-17 LAB — LIPID PANEL
CHOLESTEROL TOTAL: 221 mg/dL — AB (ref 100–199)
Chol/HDL Ratio: 4.1 ratio units (ref 0.0–4.4)
HDL: 54 mg/dL (ref >39)
LDL Calculated: 142 mg/dL — ABNORMAL HIGH (ref 0–99)
TRIGLYCERIDES: 126 mg/dL (ref 0–149)
VLDL CHOLESTEROL CAL: 25 mg/dL (ref 5–40)

## 2016-03-17 LAB — CMP14+EGFR
ALBUMIN: 4.5 g/dL (ref 3.5–4.8)
ALK PHOS: 74 IU/L (ref 39–117)
ALT: 16 IU/L (ref 0–32)
AST: 16 IU/L (ref 0–40)
Albumin/Globulin Ratio: 2.1 (ref 1.2–2.2)
BUN / CREAT RATIO: 20 (ref 12–28)
BUN: 16 mg/dL (ref 8–27)
Bilirubin Total: 0.7 mg/dL (ref 0.0–1.2)
CO2: 23 mmol/L (ref 18–29)
CREATININE: 0.81 mg/dL (ref 0.57–1.00)
Calcium: 9.3 mg/dL (ref 8.7–10.3)
Chloride: 101 mmol/L (ref 96–106)
GFR calc Af Amer: 81 mL/min/{1.73_m2} (ref >59)
GFR, EST NON AFRICAN AMERICAN: 70 mL/min/{1.73_m2} (ref >59)
GLOBULIN, TOTAL: 2.1 g/dL (ref 1.5–4.5)
Glucose: 91 mg/dL (ref 65–99)
Potassium: 4.4 mmol/L (ref 3.5–5.2)
SODIUM: 140 mmol/L (ref 134–144)
Total Protein: 6.6 g/dL (ref 6.0–8.5)

## 2016-03-17 LAB — CBC WITH DIFFERENTIAL/PLATELET
BASOS ABS: 0 10*3/uL (ref 0.0–0.2)
Basos: 0 %
EOS (ABSOLUTE): 0.1 10*3/uL (ref 0.0–0.4)
Eos: 3 %
Hematocrit: 42.3 % (ref 34.0–46.6)
Hemoglobin: 14 g/dL (ref 11.1–15.9)
IMMATURE GRANS (ABS): 0 10*3/uL (ref 0.0–0.1)
IMMATURE GRANULOCYTES: 0 %
LYMPHS: 35 %
Lymphocytes Absolute: 1.9 10*3/uL (ref 0.7–3.1)
MCH: 30.8 pg (ref 26.6–33.0)
MCHC: 33.1 g/dL (ref 31.5–35.7)
MCV: 93 fL (ref 79–97)
Monocytes Absolute: 0.7 10*3/uL (ref 0.1–0.9)
Monocytes: 13 %
NEUTROS ABS: 2.7 10*3/uL (ref 1.4–7.0)
NEUTROS PCT: 49 %
PLATELETS: 231 10*3/uL (ref 150–379)
RBC: 4.55 x10E6/uL (ref 3.77–5.28)
RDW: 14.1 % (ref 12.3–15.4)
WBC: 5.5 10*3/uL (ref 3.4–10.8)

## 2016-03-18 DIAGNOSIS — Z00129 Encounter for routine child health examination without abnormal findings: Secondary | ICD-10-CM | POA: Insufficient documentation

## 2016-03-18 DIAGNOSIS — N3281 Overactive bladder: Secondary | ICD-10-CM | POA: Insufficient documentation

## 2016-04-04 ENCOUNTER — Telehealth: Payer: Self-pay | Admitting: Physician Assistant

## 2016-04-05 ENCOUNTER — Other Ambulatory Visit: Payer: Self-pay | Admitting: Orthopedic Surgery

## 2016-04-06 NOTE — Telephone Encounter (Signed)
Last office notes and labs faxed to [facility] Attn: Bethena Roys [phone]

## 2016-05-03 ENCOUNTER — Other Ambulatory Visit: Payer: Self-pay | Admitting: Physician Assistant

## 2016-05-03 MED ORDER — AZITHROMYCIN 250 MG PO TABS: ORAL_TABLET | ORAL | 0 refills | Status: DC

## 2016-05-04 ENCOUNTER — Ambulatory Visit (INDEPENDENT_AMBULATORY_CARE_PROVIDER_SITE_OTHER): Payer: Medicare Other | Admitting: Pediatrics

## 2016-05-04 ENCOUNTER — Other Ambulatory Visit: Payer: Self-pay | Admitting: Physician Assistant

## 2016-05-04 ENCOUNTER — Encounter: Payer: Self-pay | Admitting: Pediatrics

## 2016-05-04 VITALS — BP 150/85 | HR 80 | Temp 97.1°F | Ht 60.0 in | Wt 211.0 lb

## 2016-05-04 DIAGNOSIS — J069 Acute upper respiratory infection, unspecified: Secondary | ICD-10-CM

## 2016-05-04 DIAGNOSIS — R05 Cough: Secondary | ICD-10-CM

## 2016-05-04 DIAGNOSIS — J40 Bronchitis, not specified as acute or chronic: Secondary | ICD-10-CM | POA: Diagnosis not present

## 2016-05-04 DIAGNOSIS — R059 Cough, unspecified: Secondary | ICD-10-CM

## 2016-05-04 MED ORDER — GUAIFENESIN-CODEINE 100-10 MG/5ML PO SOLN: 5.0000 mL | Freq: Three times a day (TID) | ORAL | 0 refills | Status: DC | PRN

## 2016-05-04 NOTE — Progress Notes (Signed)
  Subjective:   Patient ID: [name], female    DOB: June 18, 1940, 76 y.o.   MRN: [id number] CC: Cough   HPI: [name] is a 76 y.o. female presenting for Cough  Started coughing several days ago Thinks is better today No fevers Congestion is better today Appetite has been good No sore throat or ear pain Started on azithromycin for bronchitis yesterday  Relevant past medical, surgical, family and social history reviewed. Allergies and medications reviewed and updated. History  Smoking Status  . Never Smoker  Smokeless Tobacco  . Never Used   ROS: Per HPI   Objective:    BP (!) 150/85   Pulse 80   Temp 97.1 F (36.2 C) (Oral)   Ht 5' (1.524 m)   Wt 211 lb (95.7 kg)   BMI 41.21 kg/m   Wt Readings from Last 3 Encounters:  05/04/16 211 lb (95.7 kg)  03/16/16 209 lb 12.8 oz (95.2 kg)  10/17/15 217 lb (98.4 kg)    Gen: NAD, alert, cooperative with exam, NCAT EYES: EOMI, no conjunctival injection, or no icterus ENT:  TMs pearly gray b/l, OP without erythema, no ttp over sinuses LYMPH: no cervical LAD CV: NRRR, normal S1/S2, no murmur, distal pulses 2+ b/l Resp: CTABL, no wheezes, no crackles, normal WOB Ext: No edema, warm Neuro: Alert and oriented MSK: normal muscle bulk  Assessment & Plan:  [place] was seen today for cough.  Diagnoses and all orders for this visit:  Cough Trial of below -     guaiFENesin-codeine 100-10 MG/5ML syrup; Take 5 mLs by mouth 3 (three) times daily as needed for cough.  Bronchitis Cont azithromycin No wheezing today, normal lung exam  Acute URI Discussed symptom care, return precautions  Follow up plan: prn Assunta Found, MD [place]

## 2016-05-04 NOTE — Telephone Encounter (Signed)
Patient NTBS for follow up

## 2016-05-04 NOTE — Telephone Encounter (Signed)
Pt aware NTBS appt made for tonight

## 2016-05-31 ENCOUNTER — Other Ambulatory Visit: Payer: Self-pay | Admitting: Physician Assistant

## 2016-06-25 ENCOUNTER — Inpatient Hospital Stay: Admit: 2016-06-25 | Payer: Medicare Other | Admitting: Orthopedic Surgery

## 2016-06-25 SURGERY — SYNOVECTOMY WITH POLY EXCHANGE
Anesthesia: Spinal | Laterality: Right

## 2016-07-17 ENCOUNTER — Ambulatory Visit (INDEPENDENT_AMBULATORY_CARE_PROVIDER_SITE_OTHER): Payer: Medicare Other | Admitting: Physician Assistant

## 2016-07-17 ENCOUNTER — Other Ambulatory Visit: Payer: Self-pay | Admitting: Physician Assistant

## 2016-07-17 ENCOUNTER — Encounter: Payer: Self-pay | Admitting: Physician Assistant

## 2016-07-17 VITALS — BP 130/86 | HR 75 | Temp 97.1°F | Ht 60.0 in | Wt 212.0 lb

## 2016-07-17 DIAGNOSIS — L570 Actinic keratosis: Secondary | ICD-10-CM

## 2016-07-17 DIAGNOSIS — N3281 Overactive bladder: Secondary | ICD-10-CM

## 2016-07-17 DIAGNOSIS — F339 Major depressive disorder, recurrent, unspecified: Secondary | ICD-10-CM | POA: Diagnosis not present

## 2016-07-17 DIAGNOSIS — M1711 Unilateral primary osteoarthritis, right knee: Secondary | ICD-10-CM

## 2016-07-17 DIAGNOSIS — Z23 Encounter for immunization: Secondary | ICD-10-CM | POA: Diagnosis not present

## 2016-07-17 MED ORDER — TRAZODONE HCL 100 MG PO TABS: 100.0000 mg | ORAL_TABLET | Freq: Every day | ORAL | 3 refills | Status: DC

## 2016-07-17 MED ORDER — HYDROCODONE-ACETAMINOPHEN 5-325 MG PO TABS: 1.0000 | ORAL_TABLET | Freq: Four times a day (QID) | ORAL | 0 refills | Status: DC | PRN

## 2016-07-17 MED ORDER — CITALOPRAM HYDROBROMIDE 20 MG PO TABS: 20.0000 mg | ORAL_TABLET | Freq: Every day | ORAL | 3 refills | Status: DC

## 2016-07-17 NOTE — Patient Instructions (Signed)
Actinic Keratosis An actinic keratosis is a precancerous growth on the skin. This means that it could develop into skin cancer if it is not treated. About 1% of these growths (actinic keratoses) turn into skin cancer within one year if they are not treated. It is important to have all of these growths evaluated to determine the best treatment approach. What are the causes? This condition is caused by getting too much ultraviolet (UV) radiation from the sun or other UV light sources. What increases the risk? The following factors may make you more likely to develop this condition:  Having light-colored skin and blue eyes.  Having blonde or red hair.  Spending a lot of time in the sun.  Inadequate skin protection when outdoors. This may include:  Not using sunscreen properly.  Not covering up skin that is exposed to sunlight.  Aging. The risk of developing an actinic keratosis increases with age. What are the signs or symptoms? Actinic keratoses look like scaly, rough spots of skin.They can be as small as a pinhead or as big as a quarter. They may itch, hurt, or feel sensitive. In most cases, the growths become red. In some cases, they may be skin-colored, light tan, dark tan, pink, or a combination of any of these colors. There may be a small piece of pink or gray skin (skin tag) growing from the actinic keratosis. In some cases, it may be easier to notice actinic keratoses by feeling them, rather than seeing them. Actinic keratoses appear most often on areas of skin that get a lot of sun exposure, including the scalp, face, ears, lips, upper back, forearms, and the backs of the hands. Sometimes, actinic keratoses disappear, but many reappear a few days to a few weeks later. How is this diagnosed? This condition is usually diagnosed with a physical exam. A tissue sample may be removed from the actinic keratosis and examined under a microscope (biopsy). How is this treated?   Treatment for  this condition may include:  Scraping off the actinic keratosis (curettage).  Freezing the actinic keratosis with liquid nitrogen (cryosurgery). This causes the growth to eventually fall off the skin.  Applying medicated creams or gels to destroy the cells in the growth.  Applying chemicals to the actinic keratosis to make the outer layers of skin peel off (chemical peel).  Photodynamic therapy. In this procedure, medicated cream is applied to the actinic keratosis. This cream increases your skin's sensitivity to light. Then, a strong light is aimed at the actinic keratosis to destroy cells in the growth. Follow these instructions at home: Skin care   Apply cool, wet cloths (cool compresses) to the affected areas.  Do not scratch your skin.  Check your skin regularly for any growths, especially growths that:  Start to itch or bleed.  Change in size, shape, or color. Caring for the treated area   Keep the treated area clean and dry as told by your health care provider.  Do not apply any medicine, cream, or lotion to the treated area unless your health care provider tells you to do that.  Do not pick at blisters or try to break them open. This can cause infection and scarring.  If you have red or irritated skin after treatment, follow instructions from your health care provider about how to take care of the treated area. Make sure you:  Wash your hands with soap and water before you change your bandage (dressing). If soap and water are not available,   use hand sanitizer.  Change your dressing as told by your health care provider.  If you have red or irritated skin after treatment, check your treated area every day for signs of infection. Check for:  Swelling, pain, or more redness.  Fluid or blood.  Warmth.  Pus or a bad smell. General instructions   Take over-the-counter and prescription medicines only as told by your health care provider.  Return to your normal  activities as told by your health care provider. Ask your health care provider what activities are safe for you.  Do not use any tobacco products, such as cigarettes, chewing tobacco, and e-cigarettes. If you need help quitting, ask your health care provider.  Have a skin exam done every year by a health care provider who is a skin conditions specialist (dermatologist).  Keep all follow-up visits as told by your health care provider. This is important. How is this prevented?  Do not get sunburns.  Try to avoid the sun between 10:00 a.m. and 4:00 p.m. This is when the UV light is the strongest.  Use a sunscreen or sunblock with SPF 30 (sun protection factor 30) or greater.  Apply sunscreen before you are exposed to sunlight, and reapply periodically as often as directed by the instructions on the sunscreen container.  Always wear sunglasses that have UV protection, and always wear hats and clothing to protect your skin from sunlight.  When possible, avoid medicines that increase your sensitivity to sunlight. These include:  Certain antibiotic medicines.  Certain water pills (diuretics).  Certain prescription medicines that are used to treat acne (retinoids).  Do not use tanning beds or other indoor tanning devices. Contact a health care provider if:  You notice any changes or new growths on your skin.  You have swelling, pain, or more redness around your treated area.  You have fluid or blood coming from your treated area.  Your treated area feels warm to the touch.  You have pus or a bad smell coming from your treated area.  You have a fever.  You have a blister that becomes large and painful. This information is not intended to replace advice given to you by your health care provider. Make sure you discuss any questions you have with your health care provider. Document Released: 05/18/2008 Document Revised: 10/21/2015 Document Reviewed: 10/30/2014 Elsevier Interactive  Patient Education  2017 [employer]  

## 2016-07-17 NOTE — Addendum Note (Signed)
Addended by: Antonietta Barcelona D on: 07/17/2016 10:05 AM   Modules accepted: Orders

## 2016-07-17 NOTE — Progress Notes (Addendum)
BP 130/86   Pulse 75   Temp 97.1 F (36.2 C) (Oral)   Ht 5' (1.524 m)   Wt 212 lb (96.2 kg)   BMI 41.40 kg/m    Subjective:    Patient ID: [name], female    DOB: 1940/05/30, 76 y.o.   MRN: [id number]  HPI: [name] is a 76 y.o. female presenting on 07/17/2016 for Follow-up (4 mos) and Nevus  This patient comes in for periodic recheck on medications and conditions including OA multiple joints, memory loss mild, insomnia, depression. Complains of new lesion on right forearm that is scaling and peels, becomes irritated. Her right hip is beginning to have more pain and tightness. Discussed stretching but if it does not improve, plan xray or ortho referral.   All medications are reviewed today. There are no reports of any problems with the medications. All of the medical conditions are reviewed and updated.  Lab work is reviewed and will be ordered as medically necessary. There are no new problems reported with today's visit.   Relevant past medical, surgical, family and social history reviewed and updated as indicated. Allergies and medications reviewed and updated.  Past Medical History:  Diagnosis Date  . Anxiety   . Degeneration of cervical intervertebral disc   . Generalized osteoarthrosis, involving multiple sites   . Radiculopathy, cervical   . Rosacea keratitis   . Varicose veins     Past Surgical History:  Procedure Laterality Date  . APPENDECTOMY    . BACK SURGERY  1997  . BREAST SURGERY     implants , removed later  . CHOLECYSTECTOMY    . KNEE ARTHROPLASTY Right    07  . SHOULDER ARTHROSCOPY DISTAL CLAVICLE EXCISION AND OPEN ROTATOR CUFF REPAIR Left   . TONSILLECTOMY    . TOTAL KNEE ARTHROPLASTY Left 10/17/2015   Procedure: TOTAL KNEE ARTHROPLASTY;  Surgeon: [name], MD;  Location: [facility];  Service: Orthopedics;  Laterality: Left;    Review of Systems  Constitutional: Negative.  Negative for activity change, fatigue and fever.  HENT:  Negative.   Eyes: Negative.   Respiratory: Negative.  Negative for cough.   Cardiovascular: Negative.  Negative for chest pain.  Gastrointestinal: Negative.  Negative for abdominal pain.  Endocrine: Negative.   Genitourinary: Negative.  Negative for dysuria.  Musculoskeletal: Negative.   Skin: Positive for rash.  Neurological: Negative.     Allergies as of 07/17/2016      Reactions   Acetaminophen Other (See Comments)   Tylenol with codeine only - GI UPSET   Amoxicillin Rash   Ampicillin Rash   Codeine Phosphate Nausea Only      Medication List       Accurate as of 07/17/16  9:46 AM. Always use your most recent med list.          aspirin 325 MG EC tablet Take 1 tablet (325 mg total) by mouth 2 (two) times daily.   citalopram 20 MG tablet Commonly known as:  CELEXA Take 1 tablet (20 mg total) by mouth daily.   donepezil 5 MG tablet Commonly known as:  ARICEPT TAKE ONE TABLET AT BEDTIME   HYDROcodone-acetaminophen 5-325 MG tablet Commonly known as:  NORCO/VICODIN Take 1 tablet by mouth every 6 (six) hours as needed.   HYDROcodone-acetaminophen 5-325 MG tablet Commonly known as:  NORCO/VICODIN Take 1 tablet by mouth every 6 (six) hours as needed for moderate pain.   HYDROcodone-acetaminophen 5-325 MG tablet Commonly known as:  NORCO/VICODIN  Take 1 tablet by mouth every 6 (six) hours as needed for moderate pain.   traZODone 100 MG tablet Commonly known as:  DESYREL Take 1 tablet (100 mg total) by mouth at bedtime.          Objective:    BP 130/86   Pulse 75   Temp 97.1 F (36.2 C) (Oral)   Ht 5' (1.524 m)   Wt 212 lb (96.2 kg)   BMI 41.40 kg/m   Allergies  Allergen Reactions  . Acetaminophen Other (See Comments)    Tylenol with codeine only - GI UPSET  . Amoxicillin Rash  . Ampicillin Rash  . Codeine Phosphate Nausea Only    Physical Exam  Constitutional: She is oriented to person, place, and time. She appears well-developed and  well-nourished.  HENT:  Head: Normocephalic and atraumatic.  Right Ear: Tympanic membrane, external ear and ear canal normal.  Left Ear: Tympanic membrane, external ear and ear canal normal.  Nose: Nose normal. No rhinorrhea.  Mouth/Throat: Oropharynx is clear and moist and mucous membranes are normal. No oropharyngeal exudate or posterior oropharyngeal erythema.  Eyes: Conjunctivae and EOM are normal. Pupils are equal, round, and reactive to light.  Neck: Normal range of motion. Neck supple.  Cardiovascular: Normal rate, regular rhythm, normal heart sounds and intact distal pulses.   Pulmonary/Chest: Effort normal and breath sounds normal.  Abdominal: Soft. Bowel sounds are normal.  Neurological: She is alert and oriented to person, place, and time. She has normal reflexes.  Skin: Skin is warm and dry. Rash noted. Rash is maculopapular. There is erythema.     Rough flesh toned lesion on right forearm, treated with 3 rounds cryo therapy and covered and wound care instructions.  Psychiatric: She has a normal mood and affect. Her behavior is normal. Judgment and thought content normal.    Results for orders placed or performed in visit on 03/16/16  Bayer DCA Hb A1c Waived  Result Value Ref Range   Bayer DCA Hb A1c Waived 5.7 <7.0 %  CBC with Differential/Platelet  Result Value Ref Range   WBC 5.5 3.4 - 10.8 x10E3/uL   RBC 4.55 3.77 - 5.28 x10E6/uL   Hemoglobin 14.0 11.1 - 15.9 g/dL   Hematocrit 42.3 34.0 - 46.6 %   MCV 93 79 - 97 fL   MCH 30.8 26.6 - 33.0 pg   MCHC 33.1 31.5 - 35.7 g/dL   RDW 14.1 12.3 - 15.4 %   Platelets 231 150 - 379 x10E3/uL   Neutrophils 49 Not Estab. %   Lymphs 35 Not Estab. %   Monocytes 13 Not Estab. %   Eos 3 Not Estab. %   Basos 0 Not Estab. %   Neutrophils Absolute 2.7 1.4 - 7.0 x10E3/uL   Lymphocytes Absolute 1.9 0.7 - 3.1 x10E3/uL   Monocytes Absolute 0.7 0.1 - 0.9 x10E3/uL   EOS (ABSOLUTE) 0.1 0.0 - 0.4 x10E3/uL   Basophils Absolute 0.0 0.0 -  0.2 x10E3/uL   Immature Granulocytes 0 Not Estab. %   Immature Grans (Abs) 0.0 0.0 - 0.1 x10E3/uL  Lipid panel  Result Value Ref Range   Cholesterol, Total 221 (H) 100 - 199 mg/dL   Triglycerides 126 0 - 149 mg/dL   HDL 54 >39 mg/dL   VLDL Cholesterol Cal 25 5 - 40 mg/dL   LDL Calculated 142 (H) 0 - 99 mg/dL   Chol/HDL Ratio 4.1 0.0 - 4.4 ratio units  CMP14+EGFR  Result Value Ref Range  Glucose 91 65 - 99 mg/dL   BUN 16 8 - 27 mg/dL   Creatinine, Ser 0.81 0.57 - 1.00 mg/dL   GFR calc non Af Amer 70 >59 mL/min/1.73   GFR calc Af Amer 81 >59 mL/min/1.73   BUN/Creatinine Ratio 20 12 - 28   Sodium 140 134 - 144 mmol/L   Potassium 4.4 3.5 - 5.2 mmol/L   Chloride 101 96 - 106 mmol/L   CO2 23 18 - 29 mmol/L   Calcium 9.3 8.7 - 10.3 mg/dL   Total Protein 6.6 6.0 - 8.5 g/dL   Albumin 4.5 3.5 - 4.8 g/dL   Globulin, Total 2.1 1.5 - 4.5 g/dL   Albumin/Globulin Ratio 2.1 1.2 - 2.2   Bilirubin Total 0.7 0.0 - 1.2 mg/dL   Alkaline Phosphatase 74 39 - 117 IU/L   AST 16 0 - 40 IU/L   ALT 16 0 - 32 IU/L      Assessment & Plan:   1. Primary osteoarthritis of right knee - HYDROcodone-acetaminophen (NORCO/VICODIN) 5-325 MG tablet; Take 1 tablet by mouth every 6 (six) hours as needed.  Dispense: 120 tablet; Refill: 0 - HYDROcodone-acetaminophen (NORCO/VICODIN) 5-325 MG tablet; Take 1 tablet by mouth every 6 (six) hours as needed for moderate pain.  Dispense: 120 tablet; Refill: 0 - HYDROcodone-acetaminophen (NORCO/VICODIN) 5-325 MG tablet; Take 1 tablet by mouth every 6 (six) hours as needed for moderate pain.  Dispense: 120 tablet; Refill: 0  2. OAB (overactive bladder)  3. Depression, recurrent ([facility]) - citalopram (CELEXA) 20 MG tablet; Take 1 tablet (20 mg total) by mouth daily.  Dispense: 90 tablet; Refill: 3  4. Actinic keratosis - Cryotherapy/destruct benign or premalignant lesion   Current Outpatient Prescriptions:  .  aspirin EC 325 MG EC tablet, Take 1 tablet (325 mg total) by  mouth 2 (two) times daily., Disp: 30 tablet, Rfl: 0 .  citalopram (CELEXA) 20 MG tablet, Take 1 tablet (20 mg total) by mouth daily., Disp: 90 tablet, Rfl: 3 .  donepezil (ARICEPT) 5 MG tablet, TAKE ONE TABLET AT BEDTIME, Disp: 90 tablet, Rfl: 3 .  HYDROcodone-acetaminophen (NORCO/VICODIN) 5-325 MG tablet, Take 1 tablet by mouth every 6 (six) hours as needed., Disp: 120 tablet, Rfl: 0 .  HYDROcodone-acetaminophen (NORCO/VICODIN) 5-325 MG tablet, Take 1 tablet by mouth every 6 (six) hours as needed for moderate pain., Disp: 120 tablet, Rfl: 0 .  traZODone (DESYREL) 100 MG tablet, Take 1 tablet (100 mg total) by mouth at bedtime., Disp: 90 tablet, Rfl: 3 .  HYDROcodone-acetaminophen (NORCO/VICODIN) 5-325 MG tablet, Take 1 tablet by mouth every 6 (six) hours as needed for moderate pain., Disp: 120 tablet, Rfl: 0  Continue all other maintenance medications as listed above.  Follow up plan: Return in about 6 months (around 01/17/2017).  Educational handout given for actinic keratosis  Terald Sleeper PA-C [facility] [address]  [place], [place] [address] [phone]   07/17/2016, 9:46 AM

## 2016-07-17 NOTE — Progress Notes (Signed)
completed

## 2016-07-17 NOTE — Progress Notes (Signed)
I changed it 

## 2016-07-24 ENCOUNTER — Ambulatory Visit (INDEPENDENT_AMBULATORY_CARE_PROVIDER_SITE_OTHER): Payer: Medicare Other | Admitting: *Deleted

## 2016-07-24 ENCOUNTER — Encounter: Payer: Self-pay | Admitting: *Deleted

## 2016-07-24 VITALS — BP 133/75 | HR 59 | Temp 97.2°F | Ht 59.5 in | Wt 210.2 lb

## 2016-07-24 DIAGNOSIS — Z0000 Encounter for general adult medical examination without abnormal findings: Secondary | ICD-10-CM | POA: Diagnosis not present

## 2016-07-24 NOTE — Progress Notes (Signed)
Subjective:   [name] is a 76 y.o. female who presents for an Initial Medicare Annual Wellness Visit.  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  She lives at home with her husband and 3 small dogs.  [name] has 6 kids, 4 boys (1 passed away at the age of 78) and 2 daughters that visit often.  [name] enjoys taking care of other and reading.  She attends [employer]. [name] currently does not exercise because "she does not have time".  She feels that her health is worse than it was a year ago due to knee surgery one year ago and having trouble with right knee replacement.         Objective:    Today's Vitals   07/24/16 1007  BP: 133/75  Pulse: (!) 59  Temp: 97.2 F (36.2 C)  TempSrc: Oral  Weight: 210 lb 3.2 oz (95.3 kg)  Height: 4' 11.5" (1.511 m)   Body mass index is 41.74 kg/m.   Current Medications (verified) Outpatient Encounter Prescriptions as of 07/24/2016  Medication Sig  . aspirin EC 325 MG EC tablet Take 1 tablet (325 mg total) by mouth 2 (two) times daily.  . citalopram (CELEXA) 20 MG tablet Take 1 tablet (20 mg total) by mouth daily.  Marland Kitchen donepezil (ARICEPT) 5 MG tablet TAKE ONE TABLET AT BEDTIME  . HYDROcodone-acetaminophen (NORCO/VICODIN) 5-325 MG tablet Take 1 tablet by mouth every 6 (six) hours as needed.  Marland Kitchen HYDROcodone-acetaminophen (NORCO/VICODIN) 5-325 MG tablet Take 1 tablet by mouth every 6 (six) hours as needed for moderate pain.  Marland Kitchen HYDROcodone-acetaminophen (NORCO/VICODIN) 5-325 MG tablet Take 1 tablet by mouth every 6 (six) hours as needed for moderate pain.  Marland Kitchen ibuprofen (ADVIL,MOTRIN) 800 MG tablet TAKE  (1)  TABLET  THREE TIMES DAILY.  . traZODone (DESYREL) 100 MG tablet Take 1 tablet (100 mg total) by mouth at bedtime.   No facility-administered encounter medications on file as of 07/24/2016.     Allergies (verified) Acetaminophen; Amoxicillin; Ampicillin; and Codeine phosphate    History: Past Medical History:  Diagnosis Date  . Anxiety   . Degeneration of cervical intervertebral disc   . Generalized osteoarthrosis, involving multiple sites   . Radiculopathy, cervical   . Rosacea keratitis   . Varicose veins    Past Surgical History:  Procedure Laterality Date  . APPENDECTOMY    . BACK SURGERY  1997  . BREAST SURGERY     implants , removed later  . CHOLECYSTECTOMY    . KNEE ARTHROPLASTY Right    07  . SHOULDER ARTHROSCOPY DISTAL CLAVICLE EXCISION AND OPEN ROTATOR CUFF REPAIR Left   . TONSILLECTOMY    . TOTAL KNEE ARTHROPLASTY Left 10/17/2015   Procedure: TOTAL KNEE ARTHROPLASTY;  Surgeon: [name], MD;  Location: [facility];  Service: Orthopedics;  Laterality: Left;   Family History  Problem Relation Age of Onset  . Heart disease Mother   . Early death Mother   . Glaucoma Mother   . Heart disease Father   . Cancer Sister   . Glaucoma Brother   . Early death Sister   . Glaucoma Brother   . COPD Sister   . Glaucoma Sister    Social History   Occupational History  . Not on file.   Social History Main Topics  . Smoking status: Never Smoker  . Smokeless tobacco: Never Used  . Alcohol use Yes  Comment: occ  . Drug use: No  . Sexual activity: Not on file    Tobacco Counseling Never a smoker  Activities of Daily Living In your present state of health, do you have any difficulty performing the following activities: 07/24/2016 10/07/2015  Hearing? Y N  Vision? Y N  Difficulty concentrating or making decisions? Y N  Walking or climbing stairs? Y Y  Dressing or bathing? N N  Doing errands, shopping? N N  Some recent data might be hidden  Patient has trouble hearing if other is not speaking clearly. Will make eye exam to have vision checked. Patient states that she has some short term memory loss (MMSE done). Patient has trouble walking and climbing stairs due to knee surgery 1 year ago.   Immunizations and Health  Maintenance Immunization History  Administered Date(s) Administered  . Pneumococcal Conjugate-13 03/16/2016  . Zoster Recombinat (Shingrix) 07/17/2016  Will have tetanus done at Office Visit in November with Particia Nearing, PA There are no preventive care reminders to display for this patient.  Patient Care Team: Theodoro Clock as PCP - General (Physician Assistant) [name], MD as Consulting Physician (Orthopedic Surgery) Wilford Corner, MD as Consulting Physician (Gastroenterology)  Indicate any recent Medical Services you may have received from other than [facility] providers in the past year (date may be approximate).     Assessment:   This is a routine wellness examination for [name].   Hearing/Vision screen Will make and an appointment for eye exam.   Dietary issues and exercise activities discussed: Current Exercise Habits: The patient does not participate in regular exercise at present, Exercise limited by: None identified  Goals    . Exercise 3x per week (30 min per time)          Walk for 30 mins 3 times weekly Strengthening exercises     . Have 3 meals a day          Increase fruits and vegetables Increase water intake        Depression Screen PHQ 2/9 Scores 07/24/2016 07/17/2016 05/04/2016 03/16/2016  PHQ - 2 Score 0 0 0 0    Fall Risk Fall Risk  07/24/2016 07/17/2016 05/04/2016 03/16/2016  Falls in the past year? No No No No    Cognitive Function: MMSE - Mini Mental State Exam 07/24/2016  Orientation to time 3  Orientation to Place 5  Registration 3  Attention/ Calculation 5  Recall 3  Language- name 2 objects 2  Language- repeat 1  Language- follow 3 step command 3  Language- read & follow direction 1  Write a sentence 1  Copy design 1  Total score 28  Patient has trouble remembering dates. Keeps a calendar with appointments       Screening Tests Health Maintenance  Topic Date Due  . TETANUS/TDAP  01/18/2017 (Originally 04/19/1959)  .  INFLUENZA VACCINE  10/03/2016  . PNA vac Low Risk Adult (2 of 2 - PPSV23) 03/16/2017  . DEXA SCAN  Completed  Tetanus will be done at office visit in November    Plan:   [name] is here for a Annual wellness visit.  [name] was encouraged to exercise for 30 minutes (seated strengthening exercise handout given to patient), 3 times weekly and to increase fruits, vegetables and water intake daily. Instructed on tripping hazards with 3 small dogs at home. Encourage to make an eye appointment to have vision checked and encouraged to make an appointment with orthopedic to  have right knee replacement checked. [name] is currently taking Aricept to help with memory loss had a MMSE score of 28.  [name] was encourage to keep appointment with Particia Nearing, PAc on 01/18/2017 at 9:10am and will have tetanus vaccine done at this time.  [name] informed that she may contact the office if needed before appointment with Particia Nearing.  Encourage patient to bring copy of Advance Directive to Office to be placed in Chart.   I have personally reviewed and noted the following in the patient's chart:   . Medical and social history . Use of alcohol, tobacco or illicit drugs  . Current medications and supplements . Functional ability and status . Nutritional status . Physical activity . Advanced directives . List of other physicians . Hospitalizations, surgeries, and ER visits in previous 12 months . Vitals . Screenings to include cognitive, depression, and falls . Referrals and appointments  In addition, I have reviewed and discussed with patient certain preventive protocols, quality metrics, and best practice recommendations. A written personalized care plan for preventive services as well as general preventive health recommendations were provided to patient.     Wardell Heath, LPN   08/19/735  I have reviewed and agree with the above AWV documentation.   Terald Sleeper  PA-C [facility] [address]  [place], [place] [address] [phone]

## 2016-07-24 NOTE — Patient Instructions (Addendum)
Keep Follow up with Bartolo Darter, PA on 01/18/2017 Bring a copy of Advance Directive to Office  Tetanus Vaccine to be done at appt in November Increase fruits and vegetable Try to walk for 30 minutes 3 times weekly Schedule an appointment for Eye Exam.  Hand Out on Seated strengthening exercises   [name] , Thank you for taking time to come for your Medicare Wellness Visit. I appreciate your ongoing commitment to your health goals. Please review the following plan we discussed and let me know if I can assist you in the future.   These are the goals we discussed: Goals    . Exercise 3x per week (30 min per time)          Walk for 30 mins 3 times weekly Strengthening exercises     . Have 3 meals a day          Increase fruits and vegetables Increase water intake         This is a list of the screening recommended for you and due dates:  Health Maintenance  Topic Date Due  . Tetanus Vaccine  01/18/2017*  . Flu Shot  10/03/2016  . Pneumonia vaccines (2 of 2 - PPSV23) 03/16/2017  . DEXA scan (bone density measurement)  Completed  *Topic was postponed. The date shown is not the original due date.

## 2016-08-30 ENCOUNTER — Ambulatory Visit (INDEPENDENT_AMBULATORY_CARE_PROVIDER_SITE_OTHER): Payer: Medicare Other | Admitting: Family Medicine

## 2016-08-30 ENCOUNTER — Encounter: Payer: Self-pay | Admitting: Family Medicine

## 2016-08-30 VITALS — BP 150/83 | HR 68 | Temp 97.1°F | Ht 59.5 in | Wt 210.0 lb

## 2016-08-30 DIAGNOSIS — N3000 Acute cystitis without hematuria: Secondary | ICD-10-CM

## 2016-08-30 DIAGNOSIS — R3911 Hesitancy of micturition: Secondary | ICD-10-CM | POA: Diagnosis not present

## 2016-08-30 LAB — URINALYSIS, COMPLETE
BILIRUBIN UA: NEGATIVE
Glucose, UA: NEGATIVE
KETONES UA: NEGATIVE
Nitrite, UA: NEGATIVE
PROTEIN UA: NEGATIVE
Urobilinogen, Ur: 0.2 mg/dL (ref 0.2–1.0)
pH, UA: 6 (ref 5.0–7.5)

## 2016-08-30 LAB — MICROSCOPIC EXAMINATION: WBC, UA: >30 /hpf — AB (ref 0–?)

## 2016-08-30 MED ORDER — CIPROFLOXACIN HCL 500 MG PO TABS: 500.0000 mg | ORAL_TABLET | Freq: Two times a day (BID) | ORAL | 0 refills | Status: DC

## 2016-08-30 NOTE — Progress Notes (Signed)
BP (!) 150/83   Pulse 68   Temp 97.1 F (36.2 C) (Oral)   Ht 4' 11.5" (1.511 m)   Wt 210 lb (95.3 kg)   BMI 41.71 kg/m    Subjective:    Patient ID: [name], female    DOB: May 18, 1940, 76 y.o.   MRN: [id number]  HPI: [name] is a 76 y.o. female presenting on 08/30/2016 for Urinary Tract Infection (urinary discomfort and hesitancy, back pain)   HPI Dysuria and frequency and hesitancy Patient comes in complaining of dysuria and urinary frequency and hesitancy that just started this morning. She thinks she might have started having some back pain on the left side a couple days ago but then the symptoms really started this morning at 5 AM. She said she had the feeling of going frequently and not much was coming out. She denies any fevers but may have had some chills overnight. She denies any blood in her urine.  Relevant past medical, surgical, family and social history reviewed and updated as indicated. Interim medical history since our last visit reviewed. Allergies and medications reviewed and updated.  Review of Systems  Constitutional: Negative for chills and fever.  Respiratory: Negative for chest tightness and shortness of breath.   Cardiovascular: Negative for chest pain and leg swelling.  Genitourinary: Positive for dysuria, flank pain, frequency, hematuria and urgency. Negative for difficulty urinating, vaginal bleeding, vaginal discharge and vaginal pain.  Musculoskeletal: Negative for gait problem.  Skin: Negative for rash.  Neurological: Negative for light-headedness and headaches.  Psychiatric/Behavioral: Negative for agitation and behavioral problems.  All other systems reviewed and are negative.   Per HPI unless specifically indicated above        Objective:    BP (!) 150/83   Pulse 68   Temp 97.1 F (36.2 C) (Oral)   Ht 4' 11.5" (1.511 m)   Wt 210 lb (95.3 kg)   BMI 41.71 kg/m   Wt Readings from Last 3 Encounters:  08/30/16 210 lb  (95.3 kg)  07/24/16 210 lb 3.2 oz (95.3 kg)  07/17/16 212 lb (96.2 kg)    Physical Exam  Constitutional: She is oriented to person, place, and time. She appears well-developed and well-nourished. No distress.  Eyes: Conjunctivae are normal.  Cardiovascular: Normal rate, regular rhythm, normal heart sounds and intact distal pulses.   No murmur heard. Pulmonary/Chest: Effort normal and breath sounds normal. No respiratory distress. She has no wheezes.  Abdominal: Soft. Bowel sounds are normal. She exhibits no distension. There is no tenderness. There is no rigidity, no rebound, no guarding and no CVA tenderness.  Musculoskeletal: Normal range of motion. She exhibits no edema or tenderness.  Neurological: She is alert and oriented to person, place, and time. Coordination normal.  Skin: Skin is warm and dry. No rash noted. She is not diaphoretic.  Psychiatric: She has a normal mood and affect. Her behavior is normal.  Nursing note and vitals reviewed.  Urinalysis: Greater than 30 WBCs, 0-2 RBCs, 0-10 epithelial cells, few bacteria, trace blood and 1+ leukocytes, otherwise normal.    Assessment & Plan:   Problem List Items Addressed This Visit    None    Visit Diagnoses    Acute cystitis without hematuria    -  Primary   Relevant Medications   ciprofloxacin (CIPRO) 500 MG tablet   Other Relevant Orders   Urinalysis, Complete      Follow up plan: Return if symptoms worsen or fail to improve.  Counseling provided for all of the vaccine components Orders Placed This Encounter  Procedures  . Urinalysis, Complete    Caryl Pina, MD [facility] 08/30/2016, 10:03 AM

## 2016-09-03 DIAGNOSIS — H40033 Anatomical narrow angle, bilateral: Secondary | ICD-10-CM | POA: Diagnosis not present

## 2016-09-03 DIAGNOSIS — H04123 Dry eye syndrome of bilateral lacrimal glands: Secondary | ICD-10-CM | POA: Diagnosis not present

## 2016-09-11 ENCOUNTER — Encounter: Payer: Self-pay | Admitting: Physician Assistant

## 2016-09-11 ENCOUNTER — Ambulatory Visit (INDEPENDENT_AMBULATORY_CARE_PROVIDER_SITE_OTHER): Payer: Medicare Other | Admitting: Physician Assistant

## 2016-09-11 VITALS — BP 122/70 | HR 77 | Temp 96.8°F | Ht 59.5 in | Wt 214.4 lb

## 2016-09-11 DIAGNOSIS — R300 Dysuria: Secondary | ICD-10-CM

## 2016-09-11 DIAGNOSIS — I10 Essential (primary) hypertension: Secondary | ICD-10-CM | POA: Diagnosis not present

## 2016-09-11 DIAGNOSIS — R5383 Other fatigue: Secondary | ICD-10-CM | POA: Diagnosis not present

## 2016-09-11 DIAGNOSIS — R32 Unspecified urinary incontinence: Secondary | ICD-10-CM | POA: Diagnosis not present

## 2016-09-11 LAB — URINALYSIS, COMPLETE
Bilirubin, UA: NEGATIVE
Glucose, UA: NEGATIVE
Ketones, UA: NEGATIVE
Leukocytes, UA: NEGATIVE
Nitrite, UA: NEGATIVE
Protein, UA: NEGATIVE
RBC, UA: NEGATIVE
Specific Gravity, UA: 1.025 (ref 1.005–1.030)
Urobilinogen, Ur: 0.2 mg/dL (ref 0.2–1.0)
pH, UA: 5.5 (ref 5.0–7.5)

## 2016-09-11 LAB — MICROSCOPIC EXAMINATION
Bacteria, UA: NONE SEEN
RBC, UA: NONE SEEN /HPF
Renal Epithel, UA: NONE SEEN /HPF

## 2016-09-11 MED ORDER — OXYBUTYNIN CHLORIDE 5 MG PO TABS: 5.0000 mg | ORAL_TABLET | Freq: Three times a day (TID) | ORAL | 2 refills | Status: DC

## 2016-09-11 MED ORDER — LISINOPRIL 5 MG PO TABS: 5.0000 mg | ORAL_TABLET | Freq: Every day | ORAL | 3 refills | Status: DC

## 2016-09-11 NOTE — Progress Notes (Signed)
BP 122/70   Pulse 77   Temp (!) 96.8 F (36 C) (Oral)   Ht 4' 11.5" (1.511 m)   Wt 214 lb 6.4 oz (97.3 kg)   BMI 42.58 kg/m    Subjective:    Patient ID: [name], female    DOB: 1940/10/13, 76 y.o.   MRN: [id number]  HPI: [name] is a 76 y.o. female presenting on 09/11/2016 for Urinary Incontinence; Hypertension; Dizziness; and Numbness (hand)  Patient comes in with elevated blood pressure readings over the past few weeks. She has had 150 over 90s consistently. It was good today but she has been persistently up. She has been treated in the past with a small amount of lisinopril 2.5 mg. We have discussed starting this at 5 mg and having her come back in a month to see how she is doing. She continues with significant urinary incontinence. It is more urge related. She is wearing a pad at all times to help with accidents. She is not having any nocturnal enuresis. She is planning to follow up with [facility]. An appointment had been made in the past.  Relevant past medical, surgical, family and social history reviewed and updated as indicated. Allergies and medications reviewed and updated.  Past Medical History:  Diagnosis Date  . Anxiety   . Degeneration of cervical intervertebral disc   . Generalized osteoarthrosis, involving multiple sites   . Radiculopathy, cervical   . Rosacea keratitis   . Varicose veins     Past Surgical History:  Procedure Laterality Date  . APPENDECTOMY    . BACK SURGERY  1997  . BREAST SURGERY     implants , removed later  . CHOLECYSTECTOMY    . KNEE ARTHROPLASTY Right    07  . SHOULDER ARTHROSCOPY DISTAL CLAVICLE EXCISION AND OPEN ROTATOR CUFF REPAIR Left   . TONSILLECTOMY    . TOTAL KNEE ARTHROPLASTY Left 10/17/2015   Procedure: TOTAL KNEE ARTHROPLASTY;  Surgeon: [name], MD;  Location: [facility];  Service: Orthopedics;  Laterality: Left;    Review of Systems  Constitutional: Negative.  Negative for activity change, fatigue  and fever.  HENT: Negative.   Eyes: Negative.   Respiratory: Negative.  Negative for cough.   Cardiovascular: Negative.  Negative for chest pain.  Gastrointestinal: Negative.  Negative for abdominal pain.  Endocrine: Negative.   Genitourinary: Positive for enuresis, frequency and urgency. Negative for difficulty urinating and dysuria.  Musculoskeletal: Positive for arthralgias, back pain, gait problem and joint swelling.  Skin: Negative.     Allergies as of 09/11/2016      Reactions   Acetaminophen Other (See Comments)   Tylenol with codeine only - GI UPSET   Amoxicillin Rash   Ampicillin Rash   Codeine Phosphate Nausea Only      Medication List       Accurate as of 09/11/16  4:03 PM. Always use your most recent med list.          aspirin EC 81 MG tablet Take 81 mg by mouth daily.   citalopram 20 MG tablet Commonly known as:  CELEXA Take 1 tablet (20 mg total) by mouth daily.   donepezil 5 MG tablet Commonly known as:  ARICEPT TAKE ONE TABLET AT BEDTIME   HYDROcodone-acetaminophen 5-325 MG tablet Commonly known as:  NORCO/VICODIN Take 1 tablet by mouth every 6 (six) hours as needed.   HYDROcodone-acetaminophen 5-325 MG tablet Commonly known as:  NORCO/VICODIN Take 1 tablet by mouth every 6 (  six) hours as needed for moderate pain.   HYDROcodone-acetaminophen 5-325 MG tablet Commonly known as:  NORCO/VICODIN Take 1 tablet by mouth every 6 (six) hours as needed for moderate pain.   ibuprofen 800 MG tablet Commonly known as:  ADVIL,MOTRIN TAKE  (1)  TABLET  THREE TIMES DAILY.   lisinopril 5 MG tablet Commonly known as:  PRINIVIL,ZESTRIL Take 1 tablet (5 mg total) by mouth daily.   oxybutynin 5 MG tablet Commonly known as:  DITROPAN Take 1 tablet (5 mg total) by mouth 3 (three) times daily.   traZODone 100 MG tablet Commonly known as:  DESYREL Take 1 tablet (100 mg total) by mouth at bedtime.          Objective:    BP 122/70   Pulse 77   Temp (!)  96.8 F (36 C) (Oral)   Ht 4' 11.5" (1.511 m)   Wt 214 lb 6.4 oz (97.3 kg)   BMI 42.58 kg/m   Allergies  Allergen Reactions  . Acetaminophen Other (See Comments)    Tylenol with codeine only - GI UPSET  . Amoxicillin Rash  . Ampicillin Rash  . Codeine Phosphate Nausea Only    Physical Exam  Constitutional: She is oriented to person, place, and time. She appears well-developed and well-nourished.  HENT:  Head: Normocephalic and atraumatic.  Right Ear: Tympanic membrane, external ear and ear canal normal.  Left Ear: Tympanic membrane, external ear and ear canal normal.  Nose: Nose normal. No rhinorrhea.  Mouth/Throat: Oropharynx is clear and moist and mucous membranes are normal. No oropharyngeal exudate or posterior oropharyngeal erythema.  Eyes: Conjunctivae and EOM are normal. Pupils are equal, round, and reactive to light.  Neck: Normal range of motion. Neck supple.  Cardiovascular: Normal rate, regular rhythm, normal heart sounds and intact distal pulses.   Pulmonary/Chest: Effort normal and breath sounds normal.  Abdominal: Soft. Bowel sounds are normal.  Neurological: She is alert and oriented to person, place, and time. She has normal reflexes.  Skin: Skin is warm and dry. No rash noted.  Psychiatric: She has a normal mood and affect. Her behavior is normal. Judgment and thought content normal.    Results for orders placed or performed in visit on 09/11/16  Microscopic Examination  Result Value Ref Range   WBC, UA 0-5 0 - 5 /hpf   RBC, UA None seen 0 - 2 /hpf   Epithelial Cells (non renal) 0-10 0 - 10 /hpf   Renal Epithel, UA None seen None seen /hpf   Bacteria, UA None seen None seen/Few  Urinalysis, Complete  Result Value Ref Range   Specific Gravity, UA 1.025 1.005 - 1.030   pH, UA 5.5 5.0 - 7.5   Color, UA Yellow Yellow   Appearance Ur Clear Clear   Leukocytes, UA Negative Negative   Protein, UA Negative Negative/Trace   Glucose, UA Negative Negative    Ketones, UA Negative Negative   RBC, UA Negative Negative   Bilirubin, UA Negative Negative   Urobilinogen, Ur 0.2 0.2 - 1.0 mg/dL   Nitrite, UA Negative Negative   Microscopic Examination See below:       Assessment & Plan:   1. Dysuria - Urine Culture - Urinalysis, Complete - TSH - Lipid Panel - Microscopic Examination  2. Essential hypertension - TSH - Lipid Panel - CBC with Differential/Platelet - CMP14+EGFR - lisinopril (PRINIVIL,ZESTRIL) 5 MG tablet; Take 1 tablet (5 mg total) by mouth daily.  Dispense: 90 tablet; Refill: 3  3. Urinary incontinence, unspecified type - oxybutynin (DITROPAN) 5 MG tablet; Take 1 tablet (5 mg total) by mouth 3 (three) times daily.  Dispense: 90 tablet; Refill: 2  4. Fatigue, unspecified type - TSH - Lipid Panel - CBC with Differential/Platelet - CMP14+EGFR   Current Outpatient Prescriptions:  .  aspirin EC 81 MG tablet, Take 81 mg by mouth daily., Disp: , Rfl:  .  citalopram (CELEXA) 20 MG tablet, Take 1 tablet (20 mg total) by mouth daily., Disp: 90 tablet, Rfl: 3 .  donepezil (ARICEPT) 5 MG tablet, TAKE ONE TABLET AT BEDTIME, Disp: 90 tablet, Rfl: 3 .  HYDROcodone-acetaminophen (NORCO/VICODIN) 5-325 MG tablet, Take 1 tablet by mouth every 6 (six) hours as needed., Disp: 120 tablet, Rfl: 0 .  HYDROcodone-acetaminophen (NORCO/VICODIN) 5-325 MG tablet, Take 1 tablet by mouth every 6 (six) hours as needed for moderate pain., Disp: 120 tablet, Rfl: 0 .  HYDROcodone-acetaminophen (NORCO/VICODIN) 5-325 MG tablet, Take 1 tablet by mouth every 6 (six) hours as needed for moderate pain., Disp: 120 tablet, Rfl: 0 .  ibuprofen (ADVIL,MOTRIN) 800 MG tablet, TAKE  (1)  TABLET  THREE TIMES DAILY., Disp: 90 tablet, Rfl: 2 .  traZODone (DESYREL) 100 MG tablet, Take 1 tablet (100 mg total) by mouth at bedtime., Disp: 90 tablet, Rfl: 3 .  lisinopril (PRINIVIL,ZESTRIL) 5 MG tablet, Take 1 tablet (5 mg total) by mouth daily., Disp: 90 tablet, Rfl: 3 .   oxybutynin (DITROPAN) 5 MG tablet, Take 1 tablet (5 mg total) by mouth 3 (three) times daily., Disp: 90 tablet, Rfl: 2  Continue all other maintenance medications as listed above.  Follow up plan: Return in about 3 months (around 12/12/2016), or if symptoms worsen or fail to improve.  Educational handout given for [place] PA-C [facility] Gentry [address]  [place], [place] [address] [phone]   09/11/2016, 4:03 PM

## 2016-09-11 NOTE — Patient Instructions (Signed)
In a few days you may receive a survey in the mail or online from [employer] regarding your visit with us today. Please take a moment to fill this out. Your feedback is very important to our whole office. It can help us better understand your needs as well as improve your experience and satisfaction. Thank you for taking your time to complete it. We care about you.  Nickolus Wadding, PA-C  

## 2016-09-12 LAB — CBC WITH DIFFERENTIAL/PLATELET
BASOS ABS: 0 10*3/uL (ref 0.0–0.2)
Basos: 0 %
EOS (ABSOLUTE): 0.2 10*3/uL (ref 0.0–0.4)
Eos: 4 %
HEMOGLOBIN: 14.2 g/dL (ref 11.1–15.9)
Hematocrit: 42.5 % (ref 34.0–46.6)
IMMATURE GRANS (ABS): 0 10*3/uL (ref 0.0–0.1)
IMMATURE GRANULOCYTES: 0 %
LYMPHS: 37 %
Lymphocytes Absolute: 2.1 10*3/uL (ref 0.7–3.1)
MCH: 31.8 pg (ref 26.6–33.0)
MCHC: 33.4 g/dL (ref 31.5–35.7)
MCV: 95 fL (ref 79–97)
MONOCYTES: 10 %
Monocytes Absolute: 0.6 10*3/uL (ref 0.1–0.9)
NEUTROS ABS: 2.8 10*3/uL (ref 1.4–7.0)
NEUTROS PCT: 49 %
PLATELETS: 225 10*3/uL (ref 150–379)
RBC: 4.46 x10E6/uL (ref 3.77–5.28)
RDW: 13.7 % (ref 12.3–15.4)
WBC: 5.8 10*3/uL (ref 3.4–10.8)

## 2016-09-12 LAB — CMP14+EGFR
ALBUMIN: 4.4 g/dL (ref 3.5–4.8)
ALT: 16 IU/L (ref 0–32)
AST: 21 IU/L (ref 0–40)
Albumin/Globulin Ratio: 2 (ref 1.2–2.2)
Alkaline Phosphatase: 82 IU/L (ref 39–117)
BUN / CREAT RATIO: 26 (ref 12–28)
BUN: 18 mg/dL (ref 8–27)
Bilirubin Total: 0.5 mg/dL (ref 0.0–1.2)
CHLORIDE: 104 mmol/L (ref 96–106)
CO2: 24 mmol/L (ref 20–29)
CREATININE: 0.68 mg/dL (ref 0.57–1.00)
Calcium: 9.3 mg/dL (ref 8.7–10.3)
GFR calc non Af Amer: 84 mL/min/{1.73_m2} (ref >59)
GFR, EST AFRICAN AMERICAN: 97 mL/min/{1.73_m2} (ref >59)
GLUCOSE: 92 mg/dL (ref 65–99)
Globulin, Total: 2.2 g/dL (ref 1.5–4.5)
Potassium: 4.4 mmol/L (ref 3.5–5.2)
Sodium: 143 mmol/L (ref 134–144)
TOTAL PROTEIN: 6.6 g/dL (ref 6.0–8.5)

## 2016-09-12 LAB — LIPID PANEL
CHOL/HDL RATIO: 3.9 ratio (ref 0.0–4.4)
Cholesterol, Total: 223 mg/dL — ABNORMAL HIGH (ref 100–199)
HDL: 57 mg/dL (ref >39)
LDL Calculated: 133 mg/dL — ABNORMAL HIGH (ref 0–99)
TRIGLYCERIDES: 167 mg/dL — AB (ref 0–149)
VLDL Cholesterol Cal: 33 mg/dL (ref 5–40)

## 2016-09-12 LAB — TSH: TSH: 1.15 u[IU]/mL (ref 0.450–4.500)

## 2016-09-13 ENCOUNTER — Other Ambulatory Visit: Payer: Self-pay | Admitting: Physician Assistant

## 2016-09-13 DIAGNOSIS — N3000 Acute cystitis without hematuria: Secondary | ICD-10-CM

## 2016-09-13 LAB — URINE CULTURE

## 2016-09-13 MED ORDER — NITROFURANTOIN MONOHYD MACRO 100 MG PO CAPS: 100.0000 mg | ORAL_CAPSULE | Freq: Two times a day (BID) | ORAL | 0 refills | Status: DC

## 2016-09-13 NOTE — Progress Notes (Signed)
macrobid

## 2016-10-19 ENCOUNTER — Telehealth: Payer: Self-pay | Admitting: Physician Assistant

## 2016-10-19 NOTE — Telephone Encounter (Signed)
Patient aware that she will need to be seen per [facility].

## 2016-11-08 DIAGNOSIS — R350 Frequency of micturition: Secondary | ICD-10-CM | POA: Diagnosis not present

## 2016-11-14 ENCOUNTER — Other Ambulatory Visit: Payer: Self-pay | Admitting: Physician Assistant

## 2016-11-14 MED ORDER — PHENTERMINE HCL 37.5 MG PO CAPS: 37.5000 mg | ORAL_CAPSULE | ORAL | 1 refills | Status: DC

## 2016-11-14 NOTE — Progress Notes (Signed)
rx called into pharmacy

## 2016-11-27 DIAGNOSIS — R350 Frequency of micturition: Secondary | ICD-10-CM | POA: Diagnosis not present

## 2016-11-29 DIAGNOSIS — Z8781 Personal history of (healed) traumatic fracture: Secondary | ICD-10-CM | POA: Diagnosis not present

## 2016-11-29 DIAGNOSIS — M25711 Osteophyte, right shoulder: Secondary | ICD-10-CM | POA: Diagnosis not present

## 2016-11-29 DIAGNOSIS — M19011 Primary osteoarthritis, right shoulder: Secondary | ICD-10-CM | POA: Diagnosis not present

## 2016-11-29 DIAGNOSIS — M25811 Other specified joint disorders, right shoulder: Secondary | ICD-10-CM | POA: Diagnosis not present

## 2016-11-29 DIAGNOSIS — M25511 Pain in right shoulder: Secondary | ICD-10-CM | POA: Diagnosis not present

## 2016-12-11 DIAGNOSIS — M25811 Other specified joint disorders, right shoulder: Secondary | ICD-10-CM | POA: Diagnosis not present

## 2016-12-11 DIAGNOSIS — M19011 Primary osteoarthritis, right shoulder: Secondary | ICD-10-CM | POA: Diagnosis not present

## 2016-12-14 ENCOUNTER — Other Ambulatory Visit: Payer: Self-pay | Admitting: Orthopedic Surgery

## 2016-12-14 DIAGNOSIS — M25511 Pain in right shoulder: Secondary | ICD-10-CM

## 2016-12-19 ENCOUNTER — Ambulatory Visit: Payer: Medicare Other

## 2016-12-19 ENCOUNTER — Encounter: Payer: Self-pay | Admitting: Family Medicine

## 2016-12-19 ENCOUNTER — Ambulatory Visit (INDEPENDENT_AMBULATORY_CARE_PROVIDER_SITE_OTHER): Payer: Medicare Other | Admitting: Family Medicine

## 2016-12-19 VITALS — BP 123/86 | HR 91 | Temp 97.2°F | Ht 59.5 in | Wt 203.0 lb

## 2016-12-19 DIAGNOSIS — R058 Other specified cough: Secondary | ICD-10-CM

## 2016-12-19 DIAGNOSIS — J329 Chronic sinusitis, unspecified: Secondary | ICD-10-CM

## 2016-12-19 DIAGNOSIS — J40 Bronchitis, not specified as acute or chronic: Secondary | ICD-10-CM

## 2016-12-19 DIAGNOSIS — R05 Cough: Secondary | ICD-10-CM | POA: Diagnosis not present

## 2016-12-19 DIAGNOSIS — T464X5A Adverse effect of angiotensin-converting-enzyme inhibitors, initial encounter: Secondary | ICD-10-CM

## 2016-12-19 MED ORDER — AZITHROMYCIN 250 MG PO TABS: ORAL_TABLET | ORAL | 0 refills | Status: DC

## 2016-12-19 MED ORDER — BENZONATATE 200 MG PO CAPS: 200.0000 mg | ORAL_CAPSULE | Freq: Three times a day (TID) | ORAL | 0 refills | Status: DC | PRN

## 2016-12-19 NOTE — Progress Notes (Signed)
Chief Complaint  Patient presents with  . Cough    pt here today c/o cough and congestion    HPI  Patient presents today for Cough with upper respiratory congestion. It is nonproductive but quite loose. She's not had any drainage or sputum. She's had some nasal congestion but no Rhinorrhea. There is mild sore throat. Patient reports coughing frequently for several months but it has gotten much worse over the last couple of days.  No sputum noted. There is no fever, chills or sweats. The patient denies being short of breath.   PMH: Smoking status noted ROS: Per HPI  Objective: BP 123/86   Pulse 91   Temp (!) 97.2 F (36.2 C) (Oral)   Ht 4' 11.5" (1.511 m)   Wt 203 lb (92.1 kg)   BMI 40.31 kg/m  Gen: NAD, alert, cooperative with exam HEENT: NCAT, Nasal passages swollen, TMs are clear CV: RRR, good S1/S2, no murmur Resp: Bronchitis changes with scattered wheezes, non-labored. No rales. Cough sounds very loose and wet Ext: No edema, warm Neuro: Alert and oriented, No gross deficits  Assessment and plan:  1. Sinobronchitis   2. Cough due to ACE inhibitor     Meds ordered this encounter  Medications  . azithromycin (ZITHROMAX) 250 MG tablet    Sig: Take as directed.    Dispense:  6 each    Refill:  0  . benzonatate (TESSALON) 200 MG capsule    Sig: Take 1 capsule (200 mg total) by mouth 3 (three) times daily as needed for cough.    Dispense:  20 capsule    Refill:  0    No orders of the defined types were placed in this encounter.   Follow up as needed.  Claretta Fraise, MD

## 2016-12-24 ENCOUNTER — Encounter: Payer: Self-pay | Admitting: Physician Assistant

## 2016-12-24 ENCOUNTER — Ambulatory Visit (INDEPENDENT_AMBULATORY_CARE_PROVIDER_SITE_OTHER): Payer: Medicare Other | Admitting: Physician Assistant

## 2016-12-24 ENCOUNTER — Ambulatory Visit (INDEPENDENT_AMBULATORY_CARE_PROVIDER_SITE_OTHER): Payer: Medicare Other

## 2016-12-24 VITALS — BP 136/79 | HR 97 | Temp 96.7°F | Ht 59.5 in | Wt 203.8 lb

## 2016-12-24 DIAGNOSIS — R05 Cough: Secondary | ICD-10-CM

## 2016-12-24 DIAGNOSIS — R059 Cough, unspecified: Secondary | ICD-10-CM

## 2016-12-24 DIAGNOSIS — J189 Pneumonia, unspecified organism: Secondary | ICD-10-CM

## 2016-12-24 MED ORDER — HYDROCODONE-HOMATROPINE 5-1.5 MG/5ML PO SYRP: 5.0000 mL | ORAL_SOLUTION | Freq: Four times a day (QID) | ORAL | 0 refills | Status: DC | PRN

## 2016-12-24 MED ORDER — CEFDINIR 300 MG PO CAPS: 300.0000 mg | ORAL_CAPSULE | Freq: Two times a day (BID) | ORAL | 0 refills | Status: DC

## 2016-12-24 MED ORDER — BUDESONIDE-FORMOTEROL FUMARATE 160-4.5 MCG/ACT IN AERO: 2.0000 | INHALATION_SPRAY | Freq: Two times a day (BID) | RESPIRATORY_TRACT | 0 refills | Status: DC

## 2016-12-24 MED ORDER — METHYLPREDNISOLONE ACETATE 80 MG/ML IJ SUSP
80.0000 mg | Freq: Once | INTRAMUSCULAR | Status: AC
  Administered 2016-12-24: 80 mg via INTRAMUSCULAR

## 2016-12-24 MED ORDER — ALBUTEROL SULFATE HFA 108 (90 BASE) MCG/ACT IN AERS: 2.0000 | INHALATION_SPRAY | Freq: Four times a day (QID) | RESPIRATORY_TRACT | 0 refills | Status: DC | PRN

## 2016-12-24 NOTE — Progress Notes (Signed)
BP 136/79   Pulse 97   Temp (!) 96.7 F (35.9 C) (Oral)   Ht 4' 11.5" (1.511 m)   Wt 203 lb 12.8 oz (92.4 kg)   BMI 40.47 kg/m    Subjective:    Patient ID: [name], female    DOB: Jul 25, 1940, 76 y.o.   MRN: [id number]  HPI: [name] is a 76 y.o. female presenting on 12/24/2016 for Cough  Patient with several days of progressing upper respiratory and bronchial symptoms. Initially there was more upper respiratory congestion. This progressed to having significant cough that is productive throughout the day and severe at night. There is occasional wheezing after coughing. Sometimes there is slight dyspnea on exertion. It is productive mucus that is yellow in color. Denies any blood.   Relevant past medical, surgical, family and social history reviewed and updated as indicated. Allergies and medications reviewed and updated.  Past Medical History:  Diagnosis Date  . Anxiety   . Degeneration of cervical intervertebral disc   . Generalized osteoarthrosis, involving multiple sites   . Radiculopathy, cervical   . Rosacea keratitis   . Varicose veins     Past Surgical History:  Procedure Laterality Date  . APPENDECTOMY    . BACK SURGERY  1997  . BREAST SURGERY     implants , removed later  . CHOLECYSTECTOMY    . KNEE ARTHROPLASTY Right    07  . SHOULDER ARTHROSCOPY DISTAL CLAVICLE EXCISION AND OPEN ROTATOR CUFF REPAIR Left   . TONSILLECTOMY    . TOTAL KNEE ARTHROPLASTY Left 10/17/2015   Procedure: TOTAL KNEE ARTHROPLASTY;  Surgeon: [name], MD;  Location: [facility];  Service: Orthopedics;  Laterality: Left;    Review of Systems  Constitutional: Positive for chills and fatigue. Negative for activity change, appetite change and fever.  HENT: Positive for congestion, postnasal drip and sore throat.   Eyes: Negative.   Respiratory: Positive for cough and wheezing.   Cardiovascular: Negative.  Negative for chest pain, palpitations and leg swelling.    Gastrointestinal: Negative.   Genitourinary: Negative.   Musculoskeletal: Negative.   Skin: Negative.   Neurological: Positive for headaches.    Allergies as of 12/24/2016      Reactions   Acetaminophen Other (See Comments)   Tylenol with codeine only - GI UPSET   Amoxicillin Rash   Ampicillin Rash   Codeine Phosphate Nausea Only      Medication List       Accurate as of 12/24/16 12:35 PM. Always use your most recent med list.          albuterol 108 (90 Base) MCG/ACT inhaler Commonly known as:  PROVENTIL HFA;VENTOLIN HFA Inhale 2 puffs into the lungs every 6 (six) hours as needed for wheezing or shortness of breath.   aspirin EC 81 MG tablet Take 81 mg by mouth daily.   benzonatate 200 MG capsule Commonly known as:  TESSALON Take 1 capsule (200 mg total) by mouth 3 (three) times daily as needed for cough.   budesonide-formoterol 160-4.5 MCG/ACT inhaler Commonly known as:  SYMBICORT Inhale 2 puffs into the lungs 2 (two) times daily.   cefdinir 300 MG capsule Commonly known as:  OMNICEF Take 1 capsule (300 mg total) by mouth 2 (two) times daily. 1 po BID   citalopram 20 MG tablet Commonly known as:  CELEXA Take 1 tablet (20 mg total) by mouth daily.   donepezil 5 MG tablet Commonly known as:  ARICEPT TAKE ONE TABLET AT  BEDTIME   HYDROcodone-acetaminophen 5-325 MG tablet Commonly known as:  NORCO/VICODIN Take 1 tablet by mouth every 6 (six) hours as needed.   HYDROcodone-acetaminophen 5-325 MG tablet Commonly known as:  NORCO/VICODIN Take 1 tablet by mouth every 6 (six) hours as needed for moderate pain.   HYDROcodone-acetaminophen 5-325 MG tablet Commonly known as:  NORCO/VICODIN Take 1 tablet by mouth every 6 (six) hours as needed for moderate pain.   HYDROcodone-homatropine 5-1.5 MG/5ML syrup Commonly known as:  HYCODAN Take 5-10 mLs by mouth every 6 (six) hours as needed.   ibuprofen 800 MG tablet Commonly known as:  ADVIL,MOTRIN TAKE  (1)   TABLET  THREE TIMES DAILY.   oxybutynin 5 MG tablet Commonly known as:  DITROPAN Take 1 tablet (5 mg total) by mouth 3 (three) times daily.   phentermine 37.5 MG capsule Take 1 capsule (37.5 mg total) by mouth every morning.   traZODone 100 MG tablet Commonly known as:  DESYREL Take 1 tablet (100 mg total) by mouth at bedtime.          Objective:    BP 136/79   Pulse 97   Temp (!) 96.7 F (35.9 C) (Oral)   Ht 4' 11.5" (1.511 m)   Wt 203 lb 12.8 oz (92.4 kg)   BMI 40.47 kg/m   Allergies  Allergen Reactions  . Acetaminophen Other (See Comments)    Tylenol with codeine only - GI UPSET  . Amoxicillin Rash  . Ampicillin Rash  . Codeine Phosphate Nausea Only    Physical Exam  Constitutional: She is oriented to person, place, and time. She appears well-developed and well-nourished.  HENT:  Head: Normocephalic and atraumatic.  Right Ear: There is drainage and tenderness.  Left Ear: There is drainage and tenderness.  Nose: Mucosal edema and rhinorrhea present. Right sinus exhibits maxillary sinus tenderness and frontal sinus tenderness. Left sinus exhibits maxillary sinus tenderness and frontal sinus tenderness.  Mouth/Throat: Oropharyngeal exudate and posterior oropharyngeal erythema present.  Eyes: Pupils are equal, round, and reactive to light. Conjunctivae and EOM are normal.  Neck: Normal range of motion. Neck supple.  Cardiovascular: Normal rate, regular rhythm, normal heart sounds and intact distal pulses.   Pulmonary/Chest: Effort normal. She has wheezes in the right upper field and the left upper field.  Abdominal: Soft. Bowel sounds are normal.  Neurological: She is alert and oriented to person, place, and time. She has normal reflexes.  Skin: Skin is warm and dry. No rash noted.  Psychiatric: She has a normal mood and affect. Her behavior is normal. Judgment and thought content normal.        Assessment & Plan:   1. Cough - DG Chest 2 View; Future  2.  Atypical pneumonia - methylPREDNISolone acetate (DEPO-MEDROL) injection 80 mg; Inject 1 mL (80 mg total) into the muscle once.    Current Outpatient Prescriptions:  .  aspirin EC 81 MG tablet, Take 81 mg by mouth daily., Disp: , Rfl:  .  citalopram (CELEXA) 20 MG tablet, Take 1 tablet (20 mg total) by mouth daily., Disp: 90 tablet, Rfl: 3 .  donepezil (ARICEPT) 5 MG tablet, TAKE ONE TABLET AT BEDTIME, Disp: 90 tablet, Rfl: 3 .  HYDROcodone-acetaminophen (NORCO/VICODIN) 5-325 MG tablet, Take 1 tablet by mouth every 6 (six) hours as needed., Disp: 120 tablet, Rfl: 0 .  HYDROcodone-acetaminophen (NORCO/VICODIN) 5-325 MG tablet, Take 1 tablet by mouth every 6 (six) hours as needed for moderate pain., Disp: 120 tablet, Rfl: 0 .  HYDROcodone-acetaminophen (NORCO/VICODIN) 5-325 MG tablet, Take 1 tablet by mouth every 6 (six) hours as needed for moderate pain., Disp: 120 tablet, Rfl: 0 .  ibuprofen (ADVIL,MOTRIN) 800 MG tablet, TAKE  (1)  TABLET  THREE TIMES DAILY., Disp: 90 tablet, Rfl: 2 .  phentermine 37.5 MG capsule, Take 1 capsule (37.5 mg total) by mouth every morning., Disp: 30 capsule, Rfl: 1 .  traZODone (DESYREL) 100 MG tablet, Take 1 tablet (100 mg total) by mouth at bedtime., Disp: 90 tablet, Rfl: 3 .  albuterol (PROVENTIL HFA;VENTOLIN HFA) 108 (90 Base) MCG/ACT inhaler, Inhale 2 puffs into the lungs every 6 (six) hours as needed for wheezing or shortness of breath., Disp: 1 Inhaler, Rfl: 0 .  benzonatate (TESSALON) 200 MG capsule, Take 1 capsule (200 mg total) by mouth 3 (three) times daily as needed for cough., Disp: 20 capsule, Rfl: 0 .  budesonide-formoterol (SYMBICORT) 160-4.5 MCG/ACT inhaler, Inhale 2 puffs into the lungs 2 (two) times daily., Disp: 1 Inhaler, Rfl: 0 .  cefdinir (OMNICEF) 300 MG capsule, Take 1 capsule (300 mg total) by mouth 2 (two) times daily. 1 po BID, Disp: 20 capsule, Rfl: 0 .  HYDROcodone-homatropine (HYCODAN) 5-1.5 MG/5ML syrup, Take 5-10 mLs by mouth every 6  (six) hours as needed., Disp: 240 mL, Rfl: 0 .  oxybutynin (DITROPAN) 5 MG tablet, Take 1 tablet (5 mg total) by mouth 3 (three) times daily. (Patient not taking: Reported on 12/24/2016), Disp: 90 tablet, Rfl: 2  Current Facility-Administered Medications:  .  methylPREDNISolone acetate (DEPO-MEDROL) injection 80 mg, 80 mg, Intramuscular, Once, Taiyo Kozma S, PA-C Continue all other maintenance medications as listed above.  Follow up plan: Follow-up as needed or worsening of symptoms. Call office for any issues.   Educational handout given for Aneta PA-C [facility] [address]  [place], [place] [address] [phone]   12/24/2016, 12:35 PM

## 2016-12-24 NOTE — Patient Instructions (Signed)
In a few days you may receive a survey in the mail or online from [employer] regarding your visit with us today. Please take a moment to fill this out. Your feedback is very important to our whole office. It can help us better understand your needs as well as improve your experience and satisfaction. Thank you for taking your time to complete it. We care about you.  Antoine Vandermeulen, PA-C  

## 2016-12-25 ENCOUNTER — Ambulatory Visit
Admission: RE | Admit: 2016-12-25 | Discharge: 2016-12-25 | Disposition: A | Discharge status: Home / Self Care | Payer: Medicare Other | Source: Ambulatory Visit | Attending: Orthopedic Surgery | Admitting: Orthopedic Surgery

## 2016-12-25 DIAGNOSIS — M25511 Pain in right shoulder: Secondary | ICD-10-CM

## 2016-12-25 DIAGNOSIS — M75111 Incomplete rotator cuff tear or rupture of right shoulder, not specified as traumatic: Secondary | ICD-10-CM | POA: Diagnosis not present

## 2016-12-27 DIAGNOSIS — M75121 Complete rotator cuff tear or rupture of right shoulder, not specified as traumatic: Secondary | ICD-10-CM | POA: Diagnosis not present

## 2016-12-27 DIAGNOSIS — M7541 Impingement syndrome of right shoulder: Secondary | ICD-10-CM | POA: Diagnosis not present

## 2016-12-27 DIAGNOSIS — R350 Frequency of micturition: Secondary | ICD-10-CM | POA: Diagnosis not present

## 2017-01-01 ENCOUNTER — Ambulatory Visit (INDEPENDENT_AMBULATORY_CARE_PROVIDER_SITE_OTHER): Payer: Medicare Other | Admitting: Physician Assistant

## 2017-01-01 ENCOUNTER — Encounter: Payer: Self-pay | Admitting: Physician Assistant

## 2017-01-01 ENCOUNTER — Other Ambulatory Visit: Payer: Self-pay | Admitting: Physician Assistant

## 2017-01-01 VITALS — BP 130/81 | HR 83 | Temp 96.9°F | Ht 59.5 in | Wt 204.0 lb

## 2017-01-01 DIAGNOSIS — I10 Essential (primary) hypertension: Secondary | ICD-10-CM

## 2017-01-01 DIAGNOSIS — J40 Bronchitis, not specified as acute or chronic: Secondary | ICD-10-CM | POA: Diagnosis not present

## 2017-01-01 DIAGNOSIS — M1712 Unilateral primary osteoarthritis, left knee: Secondary | ICD-10-CM | POA: Diagnosis not present

## 2017-01-01 DIAGNOSIS — M1711 Unilateral primary osteoarthritis, right knee: Secondary | ICD-10-CM

## 2017-01-01 MED ORDER — HYDROCODONE-ACETAMINOPHEN 5-325 MG PO TABS: 1.0000 | ORAL_TABLET | Freq: Four times a day (QID) | ORAL | 0 refills | Status: DC | PRN

## 2017-01-01 MED ORDER — DOXYCYCLINE HYCLATE 100 MG PO TABS: 100.0000 mg | ORAL_TABLET | Freq: Two times a day (BID) | ORAL | 0 refills | Status: DC

## 2017-01-01 MED ORDER — HYDROCODONE-HOMATROPINE 5-1.5 MG/5ML PO SYRP: 5.0000 mL | ORAL_SOLUTION | Freq: Four times a day (QID) | ORAL | 0 refills | Status: DC | PRN

## 2017-01-01 NOTE — Patient Instructions (Signed)
In a few days you may receive a survey in the mail or online from [employer] regarding your visit with us today. Please take a moment to fill this out. Your feedback is very important to our whole office. It can help us better understand your needs as well as improve your experience and satisfaction. Thank you for taking your time to complete it. We care about you.  Bernis Schreur, PA-C  

## 2017-01-01 NOTE — Progress Notes (Signed)
BP 130/81 (BP Location: Left Arm)   Pulse 83   Temp (!) 96.9 F (36.1 C) (Oral)   Ht 4' 11.5" (1.511 m)   Wt 204 lb (92.5 kg)   BMI 40.51 kg/m    Subjective:    Patient ID: [name], female    DOB: [birth year], 76 y.o.   MRN: [id number]  HPI: [name] is a 76 y.o. female presenting on 01/01/2017 for Follow-up (6 mo)  This patient comes in for periodic recheck on medications and conditions including hypertension, osteoarthritis and chronic pain.  The patient will be having shoulder repair in coming months.  She was found to have a rotator cuff tear by her orthopedist.  She is still expressing a lot of bronchitis symptoms.  She has been several weeks now with severe cough.  It is nonproductive.  She denies any fever or chills.  She feels short of breath at times.  We have discussed using the albuterol inhaler 4 times a day regularly to try to stay ahead of the cough.  We will also treat her with doxycycline.  She is also here for recheck on her medications.  She has been taking her hydrocodone appropriately she has had the prescription last for 6 months.  We will refill the prescriptions today and she will take them to her pharmacy..   All medications are reviewed today. There are no reports of any problems with the medications. All of the medical conditions are reviewed and updated.  Lab work is reviewed and will be ordered as medically necessary. There are no new problems reported with today's visit.   Relevant past medical, surgical, family and social history reviewed and updated as indicated. Allergies and medications reviewed and updated.  Past Medical History:  Diagnosis Date  . Anxiety   . Degeneration of cervical intervertebral disc   . Generalized osteoarthrosis, involving multiple sites   . Radiculopathy, cervical   . Rosacea keratitis   . Varicose veins     Past Surgical History:  Procedure Laterality Date  . APPENDECTOMY    . BACK SURGERY  1997  .  BREAST SURGERY     implants , removed later  . CHOLECYSTECTOMY    . KNEE ARTHROPLASTY Right    07  . SHOULDER ARTHROSCOPY DISTAL CLAVICLE EXCISION AND OPEN ROTATOR CUFF REPAIR Left   . TONSILLECTOMY    . TOTAL KNEE ARTHROPLASTY Left 10/17/2015   Procedure: TOTAL KNEE ARTHROPLASTY;  Surgeon: [name], MD;  Location: [facility];  Service: Orthopedics;  Laterality: Left;    Review of Systems  Constitutional: Negative.  Negative for activity change, fatigue and fever.  HENT: Negative.  Negative for congestion and sore throat.   Eyes: Negative.   Respiratory: Positive for cough and wheezing. Negative for shortness of breath.   Cardiovascular: Negative.  Negative for chest pain, palpitations and leg swelling.  Gastrointestinal: Negative.  Negative for abdominal pain.  Endocrine: Negative.   Genitourinary: Negative.  Negative for dysuria.  Musculoskeletal: Positive for arthralgias, back pain, gait problem, joint swelling, myalgias and neck pain.  Skin: Negative.     Allergies as of 01/01/2017      Reactions   Acetaminophen Other (See Comments)   Tylenol with codeine only - GI UPSET   Amoxicillin Rash   Ampicillin Rash   Codeine Phosphate Nausea Only      Medication List       Accurate as of 01/01/17  2:23 PM. Always use your most recent med list.  albuterol 108 (90 Base) MCG/ACT inhaler Commonly known as:  PROVENTIL HFA;VENTOLIN HFA Inhale 2 puffs into the lungs every 6 (six) hours as needed for wheezing or shortness of breath.   aspirin EC 81 MG tablet Take 81 mg by mouth daily.   benzonatate 200 MG capsule Commonly known as:  TESSALON Take 1 capsule (200 mg total) by mouth 3 (three) times daily as needed for cough.   budesonide-formoterol 160-4.5 MCG/ACT inhaler Commonly known as:  SYMBICORT Inhale 2 puffs into the lungs 2 (two) times daily.   cefdinir 300 MG capsule Commonly known as:  OMNICEF Take 1 capsule (300 mg total) by mouth 2 (two) times daily. 1 po  BID   citalopram 20 MG tablet Commonly known as:  CELEXA Take 1 tablet (20 mg total) by mouth daily.   donepezil 5 MG tablet Commonly known as:  ARICEPT TAKE ONE TABLET AT BEDTIME   doxycycline 100 MG tablet Commonly known as:  VIBRA-TABS Take 1 tablet (100 mg total) by mouth 2 (two) times daily. 1 po bid   HYDROcodone-acetaminophen 5-325 MG tablet Commonly known as:  NORCO/VICODIN Take 1 tablet by mouth every 6 (six) hours as needed.   HYDROcodone-acetaminophen 5-325 MG tablet Commonly known as:  NORCO/VICODIN Take 1 tablet by mouth every 6 (six) hours as needed for moderate pain.   HYDROcodone-acetaminophen 5-325 MG tablet Commonly known as:  NORCO/VICODIN Take 1 tablet by mouth every 6 (six) hours as needed for moderate pain.   HYDROcodone-homatropine 5-1.5 MG/5ML syrup Commonly known as:  HYCODAN Take 5-10 mLs by mouth every 6 (six) hours as needed.   ibuprofen 800 MG tablet Commonly known as:  ADVIL,MOTRIN TAKE  (1)  TABLET  THREE TIMES DAILY.   phentermine 37.5 MG capsule Take 1 capsule (37.5 mg total) by mouth every morning.   traZODone 100 MG tablet Commonly known as:  DESYREL Take 1 tablet (100 mg total) by mouth at bedtime.          Objective:    BP 130/81 (BP Location: Left Arm)   Pulse 83   Temp (!) 96.9 F (36.1 C) (Oral)   Ht 4' 11.5" (1.511 m)   Wt 204 lb (92.5 kg)   BMI 40.51 kg/m   Allergies  Allergen Reactions  . Acetaminophen Other (See Comments)    Tylenol with codeine only - GI UPSET  . Amoxicillin Rash  . Ampicillin Rash  . Codeine Phosphate Nausea Only    Physical Exam  Constitutional: She is oriented to person, place, and time. She appears well-developed and well-nourished.  HENT:  Head: Normocephalic and atraumatic.  Eyes: Pupils are equal, round, and reactive to light. Conjunctivae and EOM are normal.  Cardiovascular: Normal rate, regular rhythm, normal heart sounds and intact distal pulses.   Pulmonary/Chest: Effort  normal. She has no decreased breath sounds. She has wheezes in the right upper field and the left upper field.  Abdominal: Soft. Bowel sounds are normal.  Musculoskeletal:       Right shoulder: She exhibits decreased range of motion, tenderness and crepitus.  Neurological: She is alert and oriented to person, place, and time. She has normal reflexes.  Skin: Skin is warm and dry. No rash noted.  Psychiatric: She has a normal mood and affect. Her behavior is normal. Judgment and thought content normal.        Assessment & Plan:   1. Primary osteoarthritis of right knee - HYDROcodone-acetaminophen (NORCO/VICODIN) 5-325 MG tablet; Take 1 tablet by mouth every 6 (  six) hours as needed.  Dispense: 120 tablet; Refill: 0 - HYDROcodone-acetaminophen (NORCO/VICODIN) 5-325 MG tablet; Take 1 tablet by mouth every 6 (six) hours as needed for moderate pain.  Dispense: 120 tablet; Refill: 0 - HYDROcodone-acetaminophen (NORCO/VICODIN) 5-325 MG tablet; Take 1 tablet by mouth every 6 (six) hours as needed for moderate pain.  Dispense: 120 tablet; Refill: 0  2. Bronchitis - HYDROcodone-homatropine (HYCODAN) 5-1.5 MG/5ML syrup; Take 5-10 mLs by mouth every 6 (six) hours as needed.  Dispense: 240 mL; Refill: 0 - doxycycline (VIBRA-TABS) 100 MG tablet; Take 1 tablet (100 mg total) by mouth 2 (two) times daily. 1 po bid  Dispense: 20 tablet; Refill: 0  3. Primary osteoarthritis of left knee  4. Essential hypertension    Current Outpatient Prescriptions:  .  albuterol (PROVENTIL HFA;VENTOLIN HFA) 108 (90 Base) MCG/ACT inhaler, Inhale 2 puffs into the lungs every 6 (six) hours as needed for wheezing or shortness of breath., Disp: 1 Inhaler, Rfl: 0 .  aspirin EC 81 MG tablet, Take 81 mg by mouth daily., Disp: , Rfl:  .  benzonatate (TESSALON) 200 MG capsule, Take 1 capsule (200 mg total) by mouth 3 (three) times daily as needed for cough., Disp: 20 capsule, Rfl: 0 .  budesonide-formoterol (SYMBICORT) 160-4.5  MCG/ACT inhaler, Inhale 2 puffs into the lungs 2 (two) times daily., Disp: 1 Inhaler, Rfl: 0 .  cefdinir (OMNICEF) 300 MG capsule, Take 1 capsule (300 mg total) by mouth 2 (two) times daily. 1 po BID, Disp: 20 capsule, Rfl: 0 .  citalopram (CELEXA) 20 MG tablet, Take 1 tablet (20 mg total) by mouth daily., Disp: 90 tablet, Rfl: 3 .  donepezil (ARICEPT) 5 MG tablet, TAKE ONE TABLET AT BEDTIME, Disp: 90 tablet, Rfl: 3 .  HYDROcodone-acetaminophen (NORCO/VICODIN) 5-325 MG tablet, Take 1 tablet by mouth every 6 (six) hours as needed., Disp: 120 tablet, Rfl: 0 .  HYDROcodone-acetaminophen (NORCO/VICODIN) 5-325 MG tablet, Take 1 tablet by mouth every 6 (six) hours as needed for moderate pain., Disp: 120 tablet, Rfl: 0 .  HYDROcodone-acetaminophen (NORCO/VICODIN) 5-325 MG tablet, Take 1 tablet by mouth every 6 (six) hours as needed for moderate pain., Disp: 120 tablet, Rfl: 0 .  HYDROcodone-homatropine (HYCODAN) 5-1.5 MG/5ML syrup, Take 5-10 mLs by mouth every 6 (six) hours as needed., Disp: 240 mL, Rfl: 0 .  ibuprofen (ADVIL,MOTRIN) 800 MG tablet, TAKE  (1)  TABLET  THREE TIMES DAILY., Disp: 90 tablet, Rfl: 2 .  phentermine 37.5 MG capsule, Take 1 capsule (37.5 mg total) by mouth every morning., Disp: 30 capsule, Rfl: 1 .  traZODone (DESYREL) 100 MG tablet, Take 1 tablet (100 mg total) by mouth at bedtime., Disp: 90 tablet, Rfl: 3 .  doxycycline (VIBRA-TABS) 100 MG tablet, Take 1 tablet (100 mg total) by mouth 2 (two) times daily. 1 po bid, Disp: 20 tablet, Rfl: 0 Continue all other maintenance medications as listed above.  Follow up plan: Return in about 6 months (around 07/02/2017) for recheck.  Educational handout given for [place] PA-C [facility] [address]  [place], [place] [address] [phone]   01/01/2017, 2:23 PM

## 2017-01-02 NOTE — Telephone Encounter (Signed)
rx called into pharmacy

## 2017-01-18 ENCOUNTER — Ambulatory Visit: Payer: Medicare Other | Admitting: Physician Assistant

## 2017-02-08 ENCOUNTER — Other Ambulatory Visit: Payer: Self-pay | Admitting: Physician Assistant

## 2017-02-08 DIAGNOSIS — J40 Bronchitis, not specified as acute or chronic: Secondary | ICD-10-CM

## 2017-02-13 NOTE — Telephone Encounter (Signed)
Patient aware rx is ready to be picked up 

## 2017-02-15 DIAGNOSIS — M948X1 Other specified disorders of cartilage, shoulder: Secondary | ICD-10-CM | POA: Diagnosis not present

## 2017-02-15 DIAGNOSIS — M7541 Impingement syndrome of right shoulder: Secondary | ICD-10-CM | POA: Diagnosis not present

## 2017-02-15 DIAGNOSIS — S43421A Sprain of right rotator cuff capsule, initial encounter: Secondary | ICD-10-CM | POA: Diagnosis not present

## 2017-02-15 DIAGNOSIS — M19011 Primary osteoarthritis, right shoulder: Secondary | ICD-10-CM | POA: Diagnosis not present

## 2017-02-15 DIAGNOSIS — S43491A Other sprain of right shoulder joint, initial encounter: Secondary | ICD-10-CM | POA: Diagnosis not present

## 2017-02-15 DIAGNOSIS — S46011A Strain of muscle(s) and tendon(s) of the rotator cuff of right shoulder, initial encounter: Secondary | ICD-10-CM | POA: Diagnosis not present

## 2017-02-15 DIAGNOSIS — M94211 Chondromalacia, right shoulder: Secondary | ICD-10-CM | POA: Diagnosis not present

## 2017-02-15 DIAGNOSIS — S46292A Other injury of muscle, fascia and tendon of other parts of biceps, left arm, initial encounter: Secondary | ICD-10-CM | POA: Diagnosis not present

## 2017-02-15 DIAGNOSIS — G8918 Other acute postprocedural pain: Secondary | ICD-10-CM | POA: Diagnosis not present

## 2017-03-01 ENCOUNTER — Other Ambulatory Visit: Payer: Self-pay | Admitting: Physician Assistant

## 2017-03-01 NOTE — Telephone Encounter (Signed)
Last seen 01/01/17  Glenard Haring If approved route to nurse to call into [facility]

## 2017-03-06 ENCOUNTER — Ambulatory Visit: Payer: Medicare Other | Attending: Orthopedic Surgery | Admitting: Physical Therapy

## 2017-03-06 DIAGNOSIS — G8929 Other chronic pain: Secondary | ICD-10-CM | POA: Insufficient documentation

## 2017-03-06 DIAGNOSIS — M25511 Pain in right shoulder: Secondary | ICD-10-CM | POA: Diagnosis not present

## 2017-03-06 DIAGNOSIS — M25611 Stiffness of right shoulder, not elsewhere classified: Secondary | ICD-10-CM | POA: Diagnosis not present

## 2017-03-06 NOTE — Therapy (Signed)
[facility] [place], [place], [address] Phone: [phone]   Fax:  [phone]  Physical Therapy Evaluation  Patient Details  Name: [name] MRN: [id number] Date of Birth: 08/29/1940 Referring Provider: Harden Mo MD   Encounter Date: 03/06/2017  PT End of Session - 03/06/17 1246    Visit Number  1    Number of Visits  6    Date for PT Re-Evaluation  04/17/17    Authorization Type  FOTO...Marland KitchenMarland KitchenNext visit.    PT Start Time  1115    PT Stop Time  1159    PT Time Calculation (min)  44 min       Past Medical History:  Diagnosis Date  . Anxiety   . Degeneration of cervical intervertebral disc   . Generalized osteoarthrosis, involving multiple sites   . Radiculopathy, cervical   . Rosacea keratitis   . Varicose veins     Past Surgical History:  Procedure Laterality Date  . APPENDECTOMY    . BACK SURGERY  1997  . BREAST SURGERY     implants , removed later  . CHOLECYSTECTOMY    . KNEE ARTHROPLASTY Right    07  . SHOULDER ARTHROSCOPY DISTAL CLAVICLE EXCISION AND OPEN ROTATOR CUFF REPAIR Left   . TONSILLECTOMY    . TOTAL KNEE ARTHROPLASTY Left 10/17/2015   Procedure: TOTAL KNEE ARTHROPLASTY;  Surgeon: [name], MD;  Location: [facility];  Service: Orthopedics;  Laterality: Left;    There were no vitals filed for this visit.   Subjective Assessment - 03/06/17 1238    Subjective  The patient underwent a right shoulder arthroscopic surgery performed on 02/15/17.  She is very pleased with her outcome and reports significant decrease in pain.  She reports "soreness" rated at a 2/10.      Patient Stated Goals  Use right UE without pain.    Currently in Pain?  Yes    Pain Score  2     Pain Location  Shoulder    Pain Orientation  Right    Pain Descriptors / Indicators  Sore    Pain Type  Surgical pain    Pain Onset  1 to 4 weeks ago    Pain Frequency  Intermittent    Aggravating Factors   Movement.    Pain  Relieving Factors  Rest.         [facility] PT Assessment - 03/06/17 0001      Assessment   Medical Diagnosis  Right shoulder arthroscopy.    Referring Provider  Harden Mo MD    Onset Date/Surgical Date  -- 02/15/17 (surgery date).      Precautions   Precautions  -- Per protocol.      Restrictions   Weight Bearing Restrictions  No      Balance Screen   Has the patient fallen in the past 6 months  No    Has the patient had a decrease in activity level because of a fear of falling?   No    Is the patient reluctant to leave their home because of a fear of falling?   No      Prior Function   Level of Independence  Independent      Observation/Other Assessments   Observations  Right shoulder scope sites look to be healing well.  She has some mild ecchymosis over her right Biceps region.    Focus on Therapeutic Outcomes (FOTO)   51% limitation.  Posture/Postural Control   Posture/Postural Control  Postural limitations    Postural Limitations  Rounded Shoulders;Forward head      ROM / Strength   AROM / PROM / Strength  AROM;Strength      AROM   Overall AROM Comments  Right shoulder flexion against gravity= 140 degrees; ER= 79 degrees and IR to 61 degrees.      Strength   Overall Strength Comments  Isometric strength test into IR/ER with arm by side (sub-max) demonstrated excellent muscle activation.      Palpation   Palpation comment  Patient reporting palpably "sore" diffuse around her right shoulder.      Ambulation/Gait   Gait Comments  WNL.             Objective measurements completed on examination: See above findings.      [facility] Adult PT Treatment/Exercise - 03/06/17 0001      Electrical Stimulation   Electrical Stimulation Location  -- Right shoulder.    Electrical Stimulation Action  IFC at 80-150 Hz x 20 minutes.    Electrical Stimulation Goals  Pain      Vasopneumatic   Number Minutes Vasopneumatic   20 minutes    Vasopnuematic Location   --  Right shoulder.    Vasopneumatic Pressure  Medium               PT Short Term Goals - 03/06/17 1302      PT SHORT TERM GOAL #1   Title  STG's=LTG's.        PT Long Term Goals - 03/06/17 1308      PT LONG TERM GOAL #1   Title  Independent with an advanced HEP.    Time  6    Period  Weeks    Status  New      PT LONG TERM GOAL #2   Title  Active right shoulder flexion to 155 degrees so the patient can easily reach overhead.    Time  6    Period  Weeks    Status  New      PT LONG TERM GOAL #3   Title  Active right shoulder ER to 90 degrees+ to allow for easily donning/doffing of apparel.    Time  6    Period  Weeks    Status  New      PT LONG TERM GOAL #4   Title  Perform ADL's with pain not > 2-3/10.    Time  6    Period  Weeks    Status  New             Plan - 03/06/17 1249    Clinical Impression Statement  The patient presents to OPPT s/p right shoulder arthroscopic surgery performed on 02/15/17.  She is doing extremely well.  She was provided to 2 home exercises and then intend to see her in a couple of weeks to instruct in a low-level strengthening program.  She has minimal losses of range of motion.  She is not performing all ADL's currently due to protocol limits and healing process.      Clinical Presentation  Stable    Clinical Presentation due to:  Excellent surgical outcome.    Clinical Decision Making  Low    Rehab Potential  Excellent    PT Frequency  1x / week    PT Duration  6 weeks    PT Treatment/Interventions  ADLs/Self Care Home Management;Cryotherapy;Electrical Stimulation;Therapeutic activities;Therapeutic exercise;Patient/family education;Manual techniques;Passive  range of motion;Vasopneumatic Device    PT Next Visit Plan  Review HEP; Begin RW4.  Vasopneumatic and e'stim.  AROM (ie:  Full can).    Consulted and Agree with Plan of Care  Patient       Patient will benefit from skilled therapeutic intervention in order to improve the  following deficits and impairments:  Pain, Decreased range of motion  Visit Diagnosis: Chronic right shoulder pain - Plan: PT plan of care cert/re-cert  Stiffness of right shoulder, not elsewhere classified - Plan: PT plan of care cert/re-cert     Problem List Patient Active Problem List   Diagnosis Date Noted  . Dysuria 09/11/2016  . Fatigue 09/11/2016  . Urinary incontinence 09/11/2016  . Depression, recurrent ([place]) 07/17/2016  . Actinic keratosis 07/17/2016  . Well adolescent visit 03/18/2016  . OAB (overactive bladder) 03/18/2016  . Hyperglycemia 03/16/2016  . Primary osteoarthritis of right knee 03/16/2016  . Status post total right knee replacement 10/17/2015  . Injury of left knee, leg ankle and foot 09/03/2014  . Left ankle pain 09/03/2014  . Primary osteoarthritis of left knee 09/03/2014  . HTN (hypertension) 07/02/2013  . Osteoarthritis of left knee 10/31/2012  . Knee pain 08/28/2012  . Chest pain 04/04/2011    APPLEGATE, [name] 03/06/2017, 1:17 PM  [facility] [place], [place], [address] Phone: [phone]   Fax:  [phone]  Name: Calen Posch MRN: [id number] Date of Birth: 1940/10/26

## 2017-03-19 ENCOUNTER — Ambulatory Visit: Payer: Medicare Other

## 2017-03-19 ENCOUNTER — Ambulatory Visit (INDEPENDENT_AMBULATORY_CARE_PROVIDER_SITE_OTHER): Payer: Medicare Other

## 2017-03-19 ENCOUNTER — Ambulatory Visit: Payer: Medicare Other | Admitting: Physical Therapy

## 2017-03-19 DIAGNOSIS — M25511 Pain in right shoulder: Secondary | ICD-10-CM | POA: Diagnosis not present

## 2017-03-19 DIAGNOSIS — G8929 Other chronic pain: Secondary | ICD-10-CM

## 2017-03-19 DIAGNOSIS — Z23 Encounter for immunization: Secondary | ICD-10-CM

## 2017-03-19 DIAGNOSIS — M25611 Stiffness of right shoulder, not elsewhere classified: Secondary | ICD-10-CM | POA: Diagnosis not present

## 2017-03-19 NOTE — Therapy (Addendum)
[facility] [place], [place], [address] Phone: [phone]   Fax:  [phone]  Physical Therapy Treatment  Patient Details  Name: [name] MRN: [id number] Date of Birth: 10-16-1940 Referring Provider: Harden Mo MD   Encounter Date: 03/19/2017  PT End of Session - 03/19/17 1254    Visit Number  2    Number of Visits  6    Date for PT Re-Evaluation  04/17/17    Authorization Type  FOTO...Marland KitchenMarland KitchenNext visit.    PT Start Time  1125    PT Stop Time  1159    PT Time Calculation (min)  34 min       Past Medical History:  Diagnosis Date  . Anxiety   . Degeneration of cervical intervertebral disc   . Generalized osteoarthrosis, involving multiple sites   . Radiculopathy, cervical   . Rosacea keratitis   . Varicose veins     Past Surgical History:  Procedure Laterality Date  . APPENDECTOMY    . BACK SURGERY  1997  . BREAST SURGERY     implants , removed later  . CHOLECYSTECTOMY    . KNEE ARTHROPLASTY Right    07  . SHOULDER ARTHROSCOPY DISTAL CLAVICLE EXCISION AND OPEN ROTATOR CUFF REPAIR Left   . TONSILLECTOMY    . TOTAL KNEE ARTHROPLASTY Left 10/17/2015   Procedure: TOTAL KNEE ARTHROPLASTY;  Surgeon: [name], MD;  Location: [facility];  Service: Orthopedics;  Laterality: Left;    There were no vitals filed for this visit.  Subjective Assessment - 03/19/17 1255    Subjective  The shoulder is doing great.  Just a little sore.  Patient not feeling a need to continue with physical therapy.      Pain Score  2     Pain Location  Shoulder    Pain Orientation  Right    Pain Descriptors / Indicators  Sore    Pain Type  Surgical pain    Pain Onset  1 to 4 weeks ago                      [facility] Adult PT Treatment/Exercise - 03/19/17 0001      Exercises   Exercises  Shoulder      Shoulder Exercises: Sidelying   Other Sidelying Exercises  Left sidelying position:  1# sdly ER performed ro fatigue.   Patient to use a 1# soup can at home.      Shoulder Exercises: Standing   Other Standing Exercises  Doorway stretch x 2 minutes.    Other Standing Exercises  [place] 4 performed to fatigue with yellow theraband.  Verbal and tactile cues needed.  Handouts provided.        Shoulder Exercises: Pulleys   Flexion Limitations  5 minutes.      Shoulder Exercises: ROM/Strengthening   UBE (Upper Arm Bike)  8 minutes at 120 RPM's (4 minutes forward and 4 minutes backward).               PT Short Term Goals - 03/06/17 1302      PT SHORT TERM GOAL #1   Title  STG's=LTG's.        PT Long Term Goals - 03/06/17 1308      PT LONG TERM GOAL #1   Title  Independent with an advanced HEP.    Time  6    Period  Weeks    Status  New  PT LONG TERM GOAL #2   Title  Active right shoulder flexion to 155 degrees so the patient can easily reach overhead.    Time  6    Period  Weeks    Status  New      PT LONG TERM GOAL #3   Title  Active right shoulder ER to 90 degrees+ to allow for easily donning/doffing of apparel.    Time  6    Period  Weeks    Status  New      PT LONG TERM GOAL #4   Title  Perform ADL's with pain not > 2-3/10.    Time  6    Period  Weeks    Status  New            Plan - 03/19/17 1302    Clinical Impression Statement  Patient did an excellent job today.  She is ahead of schedule regarding her right shoulder ATS.  Patient feels she can continue at home independently at this time.  She states she has a home pulley system.  I informed the patient I would leave her chart open for a time should she need further instruction and advancement.      PT Treatment/Interventions  ADLs/Self Care Home Management;Cryotherapy;Electrical Stimulation;Therapeutic activities;Therapeutic exercise;Patient/family education;Manual techniques;Passive range of motion;Vasopneumatic Device       Patient will benefit from skilled therapeutic intervention in order to improve the  following deficits and impairments:     Visit Diagnosis: Chronic right shoulder pain  Stiffness of right shoulder, not elsewhere classified     Problem List Patient Active Problem List   Diagnosis Date Noted  . Dysuria 09/11/2016  . Fatigue 09/11/2016  . Urinary incontinence 09/11/2016  . Depression, recurrent ([place]) 07/17/2016  . Actinic keratosis 07/17/2016  . Well adolescent visit 03/18/2016  . OAB (overactive bladder) 03/18/2016  . Hyperglycemia 03/16/2016  . Primary osteoarthritis of right knee 03/16/2016  . Status post total right knee replacement 10/17/2015  . Injury of left knee, leg ankle and foot 09/03/2014  . Left ankle pain 09/03/2014  . Primary osteoarthritis of left knee 09/03/2014  . HTN (hypertension) 07/02/2013  . Osteoarthritis of left knee 10/31/2012  . Knee pain 08/28/2012  . Chest pain 04/04/2011    APPLEGATE, [name] 03/19/2017, 1:06 PM  [facility] [place], [place], [address] Phone: [phone]   Fax:  [phone]  Name: [name] MRN: [id number] Date of Birth: August 04, 1940  PHYSICAL THERAPY DISCHARGE SUMMARY  Visits from Start of Care: 2.  Current functional level related to goals / functional outcomes: See above.   Remaining deficits: Pt did not return.   Education / Equipment:  Plan: Patient agrees to discharge.  Patient goals were not met. Patient is being discharged due to not returning since the last visit.  ?????         [place]ali Applegate MPT

## 2017-03-19 NOTE — Addendum Note (Signed)
Addended by: Antonietta Barcelona D on: 03/19/2017 12:33 PM   Modules accepted: Orders

## 2017-04-02 ENCOUNTER — Ambulatory Visit: Payer: Medicare Other

## 2017-04-09 ENCOUNTER — Other Ambulatory Visit: Payer: Self-pay | Admitting: Physician Assistant

## 2017-04-29 ENCOUNTER — Ambulatory Visit (INDEPENDENT_AMBULATORY_CARE_PROVIDER_SITE_OTHER): Payer: Medicare Other | Admitting: Family Medicine

## 2017-04-29 ENCOUNTER — Encounter: Payer: Self-pay | Admitting: Family Medicine

## 2017-04-29 VITALS — BP 121/81 | HR 84 | Temp 97.2°F | Ht 59.5 in | Wt 208.0 lb

## 2017-04-29 DIAGNOSIS — J40 Bronchitis, not specified as acute or chronic: Secondary | ICD-10-CM

## 2017-04-29 DIAGNOSIS — H6122 Impacted cerumen, left ear: Secondary | ICD-10-CM

## 2017-04-29 MED ORDER — CEFDINIR 300 MG PO CAPS: 300.0000 mg | ORAL_CAPSULE | Freq: Two times a day (BID) | ORAL | 0 refills | Status: DC

## 2017-04-29 MED ORDER — HYDROCODONE-HOMATROPINE 5-1.5 MG/5ML PO SYRP: 5.0000 mL | ORAL_SOLUTION | Freq: Four times a day (QID) | ORAL | 0 refills | Status: DC | PRN

## 2017-04-29 NOTE — Progress Notes (Signed)
BP 121/81   Pulse 84   Temp (!) 97.2 F (36.2 C) (Oral)   Ht 4' 11.5" (1.511 m)   Wt 208 lb (94.3 kg)   BMI 41.31 kg/m    Subjective:    Patient ID: [name], female    DOB: [birth year], 77 y.o.   MRN: [id number]  HPI: [name] is a 77 y.o. female presenting on 04/29/2017 for Cough, hoarseness, drainage, sore throat, chest congestion (cough ongoing for several months, worse in last 4 days)   HPI Cough and congestion and sore throat and drainage Patient is coming in with cough and congestion and sore throat and drainage that is been ongoing for about the past 4 days.  She has had a chronic cough for several months but she is really started feeling ill in the last 4 days and is losing her voice and has chest congestion along with chills and body aches.  She says she is used Tylenol and has not used her inhalers that she was previously given.  Patient says her husband has been ill with a similar illness over the past 4 days.  She denies any shortness of breath or wheezing but does have chest congestion and a significant cough that is keeping her up at night.  She denies any sinus pressure or drainage.  Relevant past medical, surgical, family and social history reviewed and updated as indicated. Interim medical history since our last visit reviewed. Allergies and medications reviewed and updated.  Review of Systems  Constitutional: Negative for chills and fever.  HENT: Positive for congestion, postnasal drip, rhinorrhea, sinus pressure, sneezing and sore throat. Negative for ear discharge and ear pain.   Eyes: Negative for pain, redness and visual disturbance.  Respiratory: Positive for cough. Negative for chest tightness, shortness of breath and wheezing.   Cardiovascular: Negative for chest pain and leg swelling.  Genitourinary: Negative for difficulty urinating and dysuria.  Musculoskeletal: Negative for back pain and gait problem.  Skin: Negative for rash.    Neurological: Negative for light-headedness and headaches.  Psychiatric/Behavioral: Negative for agitation and behavioral problems.  All other systems reviewed and are negative.   Per HPI unless specifically indicated above   Allergies as of 04/29/2017      Reactions   Acetaminophen Other (See Comments)   Tylenol with codeine only - GI UPSET   Amoxicillin Rash   Ampicillin Rash   Codeine Phosphate Nausea Only      Medication List        Accurate as of 04/29/17 11:59 AM. Always use your most recent med list.          albuterol 108 (90 Base) MCG/ACT inhaler Commonly known as:  PROVENTIL HFA;VENTOLIN HFA Inhale 2 puffs into the lungs every 6 (six) hours as needed for wheezing or shortness of breath.   aspirin EC 81 MG tablet Take 81 mg by mouth daily.   budesonide-formoterol 160-4.5 MCG/ACT inhaler Commonly known as:  SYMBICORT Inhale 2 puffs into the lungs 2 (two) times daily.   cefdinir 300 MG capsule Commonly known as:  OMNICEF Take 1 capsule (300 mg total) by mouth 2 (two) times daily. 1 po BID   citalopram 20 MG tablet Commonly known as:  CELEXA Take 1 tablet (20 mg total) by mouth daily.   donepezil 5 MG tablet Commonly known as:  ARICEPT TAKE ONE TABLET AT BEDTIME   HYDROcodone-acetaminophen 5-325 MG tablet Commonly known as:  NORCO/VICODIN Take 1 tablet by mouth every 6 (six) hours  as needed.   HYDROcodone-acetaminophen 5-325 MG tablet Commonly known as:  NORCO/VICODIN Take 1 tablet by mouth every 6 (six) hours as needed for moderate pain.   HYDROcodone-acetaminophen 5-325 MG tablet Commonly known as:  NORCO/VICODIN Take 1 tablet by mouth every 6 (six) hours as needed for moderate pain.   HYDROcodone-homatropine 5-1.5 MG/5ML syrup Commonly known as:  HYCODAN Take 5 mLs by mouth every 6 (six) hours as needed for cough.   ibuprofen 800 MG tablet Commonly known as:  ADVIL,MOTRIN TAKE  (1)  TABLET  THREE TIMES DAILY.   MYRBETRIQ 50 MG Tb24  tablet Generic drug:  mirabegron ER Take 50 mg by mouth daily.   phentermine 37.5 MG tablet Commonly known as:  ADIPEX-P TAKE 1 TABLET EVERY DAY   traZODone 100 MG tablet Commonly known as:  DESYREL Take 1 tablet (100 mg total) by mouth at bedtime.          Objective:    BP 121/81   Pulse 84   Temp (!) 97.2 F (36.2 C) (Oral)   Ht 4' 11.5" (1.511 m)   Wt 208 lb (94.3 kg)   BMI 41.31 kg/m   Wt Readings from Last 3 Encounters:  04/29/17 208 lb (94.3 kg)  01/01/17 204 lb (92.5 kg)  12/24/16 203 lb 12.8 oz (92.4 kg)    Physical Exam  Constitutional: She is oriented to person, place, and time. She appears well-developed and well-nourished. No distress.  HENT:  Right Ear: Tympanic membrane, external ear and ear canal normal.  Left Ear: Tympanic membrane, external ear and ear canal normal.  Nose: Mucosal edema and rhinorrhea present. No epistaxis. Right sinus exhibits no maxillary sinus tenderness and no frontal sinus tenderness. Left sinus exhibits no maxillary sinus tenderness and no frontal sinus tenderness.  Mouth/Throat: Uvula is midline and mucous membranes are normal. Posterior oropharyngeal edema and posterior oropharyngeal erythema present. No oropharyngeal exudate or tonsillar abscesses.  Eyes: Conjunctivae and EOM are normal.  Cardiovascular: Normal rate, regular rhythm, normal heart sounds and intact distal pulses.  No murmur heard. Pulmonary/Chest: Effort normal. No respiratory distress. She has no wheezes. She has rhonchi. She has no rales.  Musculoskeletal: Normal range of motion. She exhibits no edema or tenderness.  Neurological: She is alert and oriented to person, place, and time. Coordination normal.  Skin: Skin is warm and dry. No rash noted. She is not diaphoretic.  Psychiatric: She has a normal mood and affect. Her behavior is normal.  Nursing note and vitals reviewed.       Assessment & Plan:   Problem List Items Addressed This Visit    None     Visit Diagnoses    Bronchitis    -  Primary   Relevant Medications   HYDROcodone-homatropine (HYCODAN) 5-1.5 MG/5ML syrup   cefdinir (OMNICEF) 300 MG capsule   Impacted cerumen of left ear       Nurse to lavage, patient tolerated well, clear after lavage       Follow up plan: Return if symptoms worsen or fail to improve.  Counseling provided for all of the vaccine components No orders of the defined types were placed in this encounter.   Caryl Pina, MD [facility] 04/29/2017, 11:59 AM

## 2017-04-30 ENCOUNTER — Telehealth: Payer: Self-pay | Admitting: Physician Assistant

## 2017-04-30 NOTE — Telephone Encounter (Signed)
What symptoms do you have? Pink eye  How long have you been sick? Pt seen yesterday  Have you been seen for this problem? no  If your provider decides to give you a prescription, which pharmacy would you like for it to be sent to? [facility]   Patient informed that this information will be sent to the clinical staff for review and that they should receive a follow up call.

## 2017-05-01 ENCOUNTER — Other Ambulatory Visit: Payer: Self-pay | Admitting: Physician Assistant

## 2017-05-01 DIAGNOSIS — I890 Lymphedema, not elsewhere classified: Secondary | ICD-10-CM | POA: Diagnosis not present

## 2017-05-01 DIAGNOSIS — M79672 Pain in left foot: Secondary | ICD-10-CM | POA: Diagnosis not present

## 2017-05-01 MED ORDER — SULFACETAMIDE SODIUM 10 % OP SOLN: 1.0000 [drp] | OPHTHALMIC | 0 refills | Status: DC

## 2017-05-01 NOTE — Telephone Encounter (Signed)
Left detailed message.   

## 2017-05-01 NOTE — Telephone Encounter (Signed)
Script sent  

## 2017-05-03 ENCOUNTER — Encounter: Payer: Self-pay | Admitting: Physician Assistant

## 2017-05-03 ENCOUNTER — Ambulatory Visit (INDEPENDENT_AMBULATORY_CARE_PROVIDER_SITE_OTHER): Payer: Medicare Other | Admitting: Physician Assistant

## 2017-05-03 ENCOUNTER — Other Ambulatory Visit: Payer: Self-pay | Admitting: Physician Assistant

## 2017-05-03 VITALS — BP 123/70 | HR 75 | Temp 96.8°F | Ht 59.5 in | Wt 207.4 lb

## 2017-05-03 DIAGNOSIS — L729 Follicular cyst of the skin and subcutaneous tissue, unspecified: Secondary | ICD-10-CM

## 2017-05-03 DIAGNOSIS — L570 Actinic keratosis: Secondary | ICD-10-CM

## 2017-05-03 DIAGNOSIS — I890 Lymphedema, not elsewhere classified: Secondary | ICD-10-CM

## 2017-05-03 DIAGNOSIS — J209 Acute bronchitis, unspecified: Secondary | ICD-10-CM | POA: Diagnosis not present

## 2017-05-03 DIAGNOSIS — J440 Chronic obstructive pulmonary disease with acute lower respiratory infection: Secondary | ICD-10-CM

## 2017-05-03 MED ORDER — DOXYCYCLINE HYCLATE 100 MG PO TABS: 100.0000 mg | ORAL_TABLET | Freq: Two times a day (BID) | ORAL | 0 refills | Status: DC

## 2017-05-03 MED ORDER — TRAZODONE HCL 100 MG PO TABS: 200.0000 mg | ORAL_TABLET | Freq: Every day | ORAL | 3 refills | Status: DC

## 2017-05-03 MED ORDER — METHYLPREDNISOLONE ACETATE 80 MG/ML IJ SUSP
80.0000 mg | Freq: Once | INTRAMUSCULAR | Status: AC
  Administered 2017-05-03: 80 mg via INTRAMUSCULAR

## 2017-05-03 MED ORDER — BUDESONIDE-FORMOTEROL FUMARATE 160-4.5 MCG/ACT IN AERO: 2.0000 | INHALATION_SPRAY | Freq: Two times a day (BID) | RESPIRATORY_TRACT | 2 refills | Status: AC

## 2017-05-03 MED ORDER — ALBUTEROL SULFATE HFA 108 (90 BASE) MCG/ACT IN AERS: 2.0000 | INHALATION_SPRAY | Freq: Four times a day (QID) | RESPIRATORY_TRACT | 0 refills | Status: DC | PRN

## 2017-05-06 DIAGNOSIS — I890 Lymphedema, not elsewhere classified: Secondary | ICD-10-CM | POA: Insufficient documentation

## 2017-05-06 NOTE — Patient Instructions (Signed)
In a few days you may receive a survey in the mail or online from [employer] regarding your visit with us today. Please take a moment to fill this out. Your feedback is very important to our whole office. It can help us better understand your needs as well as improve your experience and satisfaction. Thank you for taking your time to complete it. We care about you.  Joreen Swearingin, PA-C  

## 2017-05-06 NOTE — Progress Notes (Signed)
BP 123/70   Pulse 75   Temp (!) 96.8 F (36 C) (Oral)   Ht 4' 11.5" (1.511 m)   Wt 207 lb 6.4 oz (94.1 kg)   BMI 41.19 kg/m    Subjective:    Patient ID: [name], female    DOB: 1940-05-30, 77 y.o.   MRN: [id number]  HPI: [name] is a 77 y.o. female presenting on 05/03/2017 for Bump on neck and sore on side of forehead  Patient with several days of progressing upper respiratory and bronchial symptoms. Initially there was more upper respiratory congestion. This progressed to having significant cough that is productive throughout the day and severe at night. There is occasional wheezing after coughing. Sometimes there is slight dyspnea on exertion. It is productive mucus that is yellow in color. Denies any blood.  Patient also reports irritated actinic keratoses on the left forehead.  She has them sometimes with her brush.  They have bled in the past.  We recommended dermatology evaluation.  The patient also has a newly grown cyst on the left anterior neck.  It is not drained.  It is not red or with pus.  Past Medical History:  Diagnosis Date  . Anxiety   . Degeneration of cervical intervertebral disc   . Generalized osteoarthrosis, involving multiple sites   . Radiculopathy, cervical   . Rosacea keratitis   . Varicose veins    Relevant past medical, surgical, family and social history reviewed and updated as indicated. Interim medical history since our last visit reviewed. Allergies and medications reviewed and updated. DATA REVIEWED: CHART IN EPIC  Family History reviewed for pertinent findings.  Review of Systems  Constitutional: Positive for chills and fatigue. Negative for activity change and appetite change.  HENT: Positive for congestion, postnasal drip and sore throat.   Eyes: Negative.   Respiratory: Positive for cough and wheezing.   Cardiovascular: Negative.  Negative for chest pain, palpitations and leg swelling.  Gastrointestinal: Negative.     Genitourinary: Negative.   Musculoskeletal: Negative.   Skin: Negative.   Neurological: Positive for headaches.    Allergies as of 05/03/2017      Reactions   Acetaminophen Other (See Comments)   Tylenol with codeine only - GI UPSET   Amoxicillin Rash   Ampicillin Rash   Codeine Phosphate Nausea Only      Medication List        Accurate as of 05/03/17 11:59 PM. Always use your most recent med list.          albuterol 108 (90 Base) MCG/ACT inhaler Commonly known as:  PROVENTIL HFA;VENTOLIN HFA Inhale 2 puffs into the lungs every 6 (six) hours as needed for wheezing or shortness of breath.   aspirin EC 81 MG tablet Take 81 mg by mouth daily.   budesonide-formoterol 160-4.5 MCG/ACT inhaler Commonly known as:  SYMBICORT Inhale 2 puffs into the lungs 2 (two) times daily.   cefdinir 300 MG capsule Commonly known as:  OMNICEF Take 1 capsule (300 mg total) by mouth 2 (two) times daily. 1 po BID   citalopram 20 MG tablet Commonly known as:  CELEXA Take 1 tablet (20 mg total) by mouth daily.   donepezil 5 MG tablet Commonly known as:  ARICEPT TAKE ONE TABLET AT BEDTIME   doxycycline 100 MG tablet Commonly known as:  VIBRA-TABS Take 1 tablet (100 mg total) by mouth 2 (two) times daily.   HYDROcodone-acetaminophen 5-325 MG tablet Commonly known as:  NORCO/VICODIN Take 1  tablet by mouth every 6 (six) hours as needed.   HYDROcodone-acetaminophen 5-325 MG tablet Commonly known as:  NORCO/VICODIN Take 1 tablet by mouth every 6 (six) hours as needed for moderate pain.   HYDROcodone-acetaminophen 5-325 MG tablet Commonly known as:  NORCO/VICODIN Take 1 tablet by mouth every 6 (six) hours as needed for moderate pain.   HYDROcodone-homatropine 5-1.5 MG/5ML syrup Commonly known as:  HYCODAN Take 5 mLs by mouth every 6 (six) hours as needed for cough.   ibuprofen 800 MG tablet Commonly known as:  ADVIL,MOTRIN TAKE  (1)  TABLET  THREE TIMES DAILY.   MYRBETRIQ 50 MG Tb24  tablet Generic drug:  mirabegron ER Take 50 mg by mouth daily.   phentermine 37.5 MG tablet Commonly known as:  ADIPEX-P TAKE 1 TABLET EVERY DAY   sulfacetamide 10 % ophthalmic solution Commonly known as:  BLEPH-10 Place 1 drop into both eyes every 4 (four) hours.   traZODone 100 MG tablet Commonly known as:  DESYREL Take 2-3 tablets (200-300 mg total) by mouth at bedtime.          Objective:    BP 123/70   Pulse 75   Temp (!) 96.8 F (36 C) (Oral)   Ht 4' 11.5" (1.511 m)   Wt 207 lb 6.4 oz (94.1 kg)   BMI 41.19 kg/m   Allergies  Allergen Reactions  . Acetaminophen Other (See Comments)    Tylenol with codeine only - GI UPSET  . Amoxicillin Rash  . Ampicillin Rash  . Codeine Phosphate Nausea Only    Wt Readings from Last 3 Encounters:  05/03/17 207 lb 6.4 oz (94.1 kg)  04/29/17 208 lb (94.3 kg)  01/01/17 204 lb (92.5 kg)    Physical Exam  Constitutional: She is oriented to person, place, and time. She appears well-developed and well-nourished.  HENT:  Head: Normocephalic and atraumatic.  Right Ear: There is drainage and tenderness.  Left Ear: There is drainage and tenderness.  Nose: Mucosal edema and rhinorrhea present. Right sinus exhibits no maxillary sinus tenderness and no frontal sinus tenderness. Left sinus exhibits no maxillary sinus tenderness and no frontal sinus tenderness.  Mouth/Throat: Oropharyngeal exudate and posterior oropharyngeal erythema present.  Eyes: Conjunctivae and EOM are normal. Pupils are equal, round, and reactive to light.  Neck: Normal range of motion. Neck supple.  Cardiovascular: Normal rate, regular rhythm, normal heart sounds and intact distal pulses.  Pulmonary/Chest: Effort normal. She has wheezes in the right upper field and the left upper field.  Abdominal: Soft. Bowel sounds are normal.  Neurological: She is alert and oriented to person, place, and time. She has normal reflexes.  Skin: Skin is warm and dry. Lesion and  rash noted. Rash is nodular. There is erythema.     Psychiatric: She has a normal mood and affect. Her behavior is normal. Judgment and thought content normal.    Results for orders placed or performed in visit on 09/11/16  Urine Culture  Result Value Ref Range   Urine Culture, Routine Final report (A)    Organism ID, Bacteria Escherichia coli (A)    Antimicrobial Susceptibility Comment   Microscopic Examination  Result Value Ref Range   WBC, UA 0-5 0 - 5 /hpf   RBC, UA None seen 0 - 2 /hpf   Epithelial Cells (non renal) 0-10 0 - 10 /hpf   Renal Epithel, UA None seen None seen /hpf   Bacteria, UA None seen None seen/Few  Urinalysis, Complete  Result Value  Ref Range   Specific Gravity, UA 1.025 1.005 - 1.030   pH, UA 5.5 5.0 - 7.5   Color, UA Yellow Yellow   Appearance Ur Clear Clear   Leukocytes, UA Negative Negative   Protein, UA Negative Negative/Trace   Glucose, UA Negative Negative   Ketones, UA Negative Negative   RBC, UA Negative Negative   Bilirubin, UA Negative Negative   Urobilinogen, Ur 0.2 0.2 - 1.0 mg/dL   Nitrite, UA Negative Negative   Microscopic Examination See below:   TSH  Result Value Ref Range   TSH 1.150 0.450 - 4.500 uIU/mL  Lipid Panel  Result Value Ref Range   Cholesterol, Total 223 (H) 100 - 199 mg/dL   Triglycerides 167 (H) 0 - 149 mg/dL   HDL 57 >39 mg/dL   VLDL Cholesterol Cal 33 5 - 40 mg/dL   LDL Calculated 133 (H) 0 - 99 mg/dL   Chol/HDL Ratio 3.9 0.0 - 4.4 ratio  CBC with Differential/Platelet  Result Value Ref Range   WBC 5.8 3.4 - 10.8 x10E3/uL   RBC 4.46 3.77 - 5.28 x10E6/uL   Hemoglobin 14.2 11.1 - 15.9 g/dL   Hematocrit 42.5 34.0 - 46.6 %   MCV 95 79 - 97 fL   MCH 31.8 26.6 - 33.0 pg   MCHC 33.4 31.5 - 35.7 g/dL   RDW 13.7 12.3 - 15.4 %   Platelets 225 150 - 379 x10E3/uL   Neutrophils 49 Not Estab. %   Lymphs 37 Not Estab. %   Monocytes 10 Not Estab. %   Eos 4 Not Estab. %   Basos 0 Not Estab. %   Neutrophils Absolute  2.8 1.4 - 7.0 x10E3/uL   Lymphocytes Absolute 2.1 0.7 - 3.1 x10E3/uL   Monocytes Absolute 0.6 0.1 - 0.9 x10E3/uL   EOS (ABSOLUTE) 0.2 0.0 - 0.4 x10E3/uL   Basophils Absolute 0.0 0.0 - 0.2 x10E3/uL   Immature Granulocytes 0 Not Estab. %   Immature Grans (Abs) 0.0 0.0 - 0.1 x10E3/uL  CMP14+EGFR  Result Value Ref Range   Glucose 92 65 - 99 mg/dL   BUN 18 8 - 27 mg/dL   Creatinine, Ser 0.68 0.57 - 1.00 mg/dL   GFR calc non Af Amer 84 >59 mL/min/1.73   GFR calc Af Amer 97 >59 mL/min/1.73   BUN/Creatinine Ratio 26 12 - 28   Sodium 143 134 - 144 mmol/L   Potassium 4.4 3.5 - 5.2 mmol/L   Chloride 104 96 - 106 mmol/L   CO2 24 20 - 29 mmol/L   Calcium 9.3 8.7 - 10.3 mg/dL   Total Protein 6.6 6.0 - 8.5 g/dL   Albumin 4.4 3.5 - 4.8 g/dL   Globulin, Total 2.2 1.5 - 4.5 g/dL   Albumin/Globulin Ratio 2.0 1.2 - 2.2   Bilirubin Total 0.5 0.0 - 1.2 mg/dL   Alkaline Phosphatase 82 39 - 117 IU/L   AST 21 0 - 40 IU/L   ALT 16 0 - 32 IU/L      Assessment & Plan:   1. Acute bronchitis with COPD ([place]) - methylPREDNISolone acetate (DEPO-MEDROL) injection 80 mg  2. Lymphedema - Ambulatory referral to Physical Therapy  3. Actinic keratoses - Ambulatory referral to Dermatology  4. Cyst of skin - Ambulatory referral to Dermatology   Continue all other maintenance medications as listed above.  Follow up plan: No Follow-up on file.  Educational handout given for [place] PA-C [place] [address]  [employer] [place], [place] [address] [phone]   05/06/2017, 9:36 AM

## 2017-05-13 DIAGNOSIS — L720 Epidermal cyst: Secondary | ICD-10-CM | POA: Diagnosis not present

## 2017-05-13 DIAGNOSIS — L820 Inflamed seborrheic keratosis: Secondary | ICD-10-CM | POA: Diagnosis not present

## 2017-05-13 DIAGNOSIS — Z1283 Encounter for screening for malignant neoplasm of skin: Secondary | ICD-10-CM | POA: Diagnosis not present

## 2017-05-15 DIAGNOSIS — I890 Lymphedema, not elsewhere classified: Secondary | ICD-10-CM | POA: Diagnosis not present

## 2017-05-19 ENCOUNTER — Other Ambulatory Visit: Payer: Self-pay | Admitting: Physician Assistant

## 2017-05-19 MED ORDER — ALBUTEROL SULFATE HFA 108 (90 BASE) MCG/ACT IN AERS: 2.0000 | INHALATION_SPRAY | Freq: Four times a day (QID) | RESPIRATORY_TRACT | 5 refills | Status: DC | PRN

## 2017-05-21 ENCOUNTER — Other Ambulatory Visit: Payer: Self-pay | Admitting: Physician Assistant

## 2017-05-28 DIAGNOSIS — I890 Lymphedema, not elsewhere classified: Secondary | ICD-10-CM | POA: Diagnosis not present

## 2017-06-05 DIAGNOSIS — I890 Lymphedema, not elsewhere classified: Secondary | ICD-10-CM | POA: Diagnosis not present

## 2017-06-11 DIAGNOSIS — I890 Lymphedema, not elsewhere classified: Secondary | ICD-10-CM | POA: Diagnosis not present

## 2017-06-24 ENCOUNTER — Ambulatory Visit (INDEPENDENT_AMBULATORY_CARE_PROVIDER_SITE_OTHER): Payer: Medicare Other

## 2017-06-24 ENCOUNTER — Encounter: Payer: Self-pay | Admitting: Family Medicine

## 2017-06-24 ENCOUNTER — Ambulatory Visit (INDEPENDENT_AMBULATORY_CARE_PROVIDER_SITE_OTHER): Payer: Medicare Other | Admitting: Family Medicine

## 2017-06-24 VITALS — BP 124/75 | HR 97 | Temp 97.1°F | Ht 59.5 in | Wt 199.4 lb

## 2017-06-24 DIAGNOSIS — R05 Cough: Secondary | ICD-10-CM

## 2017-06-24 DIAGNOSIS — R059 Cough, unspecified: Secondary | ICD-10-CM

## 2017-06-24 MED ORDER — LEVOFLOXACIN 750 MG PO TABS: 750.0000 mg | ORAL_TABLET | Freq: Every day | ORAL | 0 refills | Status: DC

## 2017-06-24 MED ORDER — PREDNISONE 20 MG PO TABS: 40.0000 mg | ORAL_TABLET | Freq: Every day | ORAL | 0 refills | Status: DC

## 2017-06-24 NOTE — Progress Notes (Signed)
   HPI  Patient presents today with cough.  Patient explains that she has had difficult time with coughing illness this for about a year.  She was seen in February treated with [facility] with good improvement, then again in March with doxycycline with good improvement.  Now she has worsening cough and increased production of sputum for the last 3 days. She has mild to moderate shortness of breath, bilateral lower thoracic back pain with cough.  She is tolerating food and fluids like usual  PMH: Smoking status noted ROS: Per HPI  Objective: BP 124/75   Pulse 97   Temp (!) 97.1 F (36.2 C) (Oral)   Ht 4' 11.5" (1.511 m)   Wt 199 lb 6.4 oz (90.4 kg)   SpO2 95%   BMI 39.60 kg/m  Gen: NAD, alert, cooperative with exam, deep wet sounding cough HEENT: NCAT CV: RRR, good S1/S2, no murmur Resp: CTABL, coarse breath sounds scattered Ext: No edema, warm Neuro: Alert and oriented, No gross deficits  Assessment and plan:  #Cough Possible underlying COPD Treating a COPD exacerbation with Levaquin plus prednisone Refer to rheumatology for further evaluation and to consider underlying lung disorder. Continue Symbicort   Orders Placed This Encounter  Procedures  . DG Chest 2 View    Standing Status:   Future    Standing Expiration Date:   08/25/2018    Order Specific Question:   Reason for Exam (SYMPTOM  OR DIAGNOSIS REQUIRED)    Answer:   cough    Order Specific Question:   Preferred imaging location?    Answer:   Internal    Order Specific Question:   Radiology Contrast Protocol - do NOT remove file path    Answer:   [email or web address]  . Ambulatory referral to Pulmonology    Referral Priority:   Routine    Referral Type:   Consultation    Referral Reason:   Specialty Services Required    Requested Specialty:   Pulmonary Disease    Number of Visits Requested:   1    Meds ordered this encounter  Medications  . levofloxacin  (LEVAQUIN) 750 MG tablet    Sig: Take 1 tablet (750 mg total) by mouth daily.    Dispense:  10 tablet    Refill:  0  . predniSONE (DELTASONE) 20 MG tablet    Sig: Take 2 tablets (40 mg total) by mouth daily with breakfast.    Dispense:  10 tablet    Refill:  0    Laroy Apple, MD [facility] 06/24/2017, 10:47 AM

## 2017-06-26 DIAGNOSIS — I890 Lymphedema, not elsewhere classified: Secondary | ICD-10-CM | POA: Diagnosis not present

## 2017-07-01 DIAGNOSIS — I890 Lymphedema, not elsewhere classified: Secondary | ICD-10-CM | POA: Diagnosis not present

## 2017-07-03 ENCOUNTER — Ambulatory Visit (INDEPENDENT_AMBULATORY_CARE_PROVIDER_SITE_OTHER): Payer: Medicare Other | Admitting: Physician Assistant

## 2017-07-03 ENCOUNTER — Encounter: Payer: Self-pay | Admitting: Physician Assistant

## 2017-07-03 DIAGNOSIS — J40 Bronchitis, not specified as acute or chronic: Secondary | ICD-10-CM

## 2017-07-03 DIAGNOSIS — M1711 Unilateral primary osteoarthritis, right knee: Secondary | ICD-10-CM | POA: Diagnosis not present

## 2017-07-03 MED ORDER — HYDROCODONE-ACETAMINOPHEN 5-325 MG PO TABS: 1.0000 | ORAL_TABLET | Freq: Four times a day (QID) | ORAL | 0 refills | Status: DC | PRN

## 2017-07-03 MED ORDER — HYDROCODONE-HOMATROPINE 5-1.5 MG/5ML PO SYRP: 5.0000 mL | ORAL_SOLUTION | Freq: Four times a day (QID) | ORAL | 0 refills | Status: DC | PRN

## 2017-07-07 NOTE — Progress Notes (Signed)
BP 130/63   Pulse 98   Temp (!) 97.2 F (36.2 C) (Oral)   Ht 4' 11.5" (1.511 m)   Wt 198 lb (89.8 kg)   BMI 39.32 kg/m    Subjective:    Patient ID: [name], female    DOB: April 08, 1940, 77 y.o.   MRN: [id number]  HPI: [name] is a 77 y.o. female presenting on 07/03/2017 for Follow-up (6 month ) and Hypertension  This patient comes in for a 1-monthrecheck on her conditions.  She has been having a very bad time with bronchitis this past winter.  She has an appointment with pulmonology coming up soon.  She has been trying to use her inhalers.  She has not been using Symbicort for the past week.  She noticed a great in crease in her cough and wheezing.  I encouraged her to use this regularly and still continue with albuterol as needed.  All of her other medications are reviewed and we will plan to see her back in a few months.  Past Medical History:  Diagnosis Date  . Anxiety   . Degeneration of cervical intervertebral disc   . Generalized osteoarthrosis, involving multiple sites   . Radiculopathy, cervical   . Rosacea keratitis   . Varicose veins    Relevant past medical, surgical, family and social history reviewed and updated as indicated. Interim medical history since our last visit reviewed. Allergies and medications reviewed and updated. DATA REVIEWED: CHART IN EPIC  Family History reviewed for pertinent findings.  Review of Systems  Constitutional: Negative.   HENT: Negative.   Eyes: Negative.   Respiratory: Positive for cough and wheezing.   Gastrointestinal: Negative.   Genitourinary: Negative.   Musculoskeletal: Positive for arthralgias, joint swelling and myalgias.    Allergies as of 07/03/2017      Reactions   Acetaminophen Other (See Comments)   Tylenol with codeine only - GI UPSET   Amoxicillin Rash   Ampicillin Rash   Codeine Phosphate Nausea Only      Medication List        Accurate as of 07/03/17 11:59 PM. Always use your most recent med  list.          albuterol 108 (90 Base) MCG/ACT inhaler Commonly known as:  PROVENTIL HFA;VENTOLIN HFA Inhale 2 puffs into the lungs every 6 (six) hours as needed for wheezing or shortness of breath.   aspirin EC 81 MG tablet Take 81 mg by mouth daily.   budesonide-formoterol 160-4.5 MCG/ACT inhaler Commonly known as:  SYMBICORT Inhale 2 puffs into the lungs 2 (two) times daily.   citalopram 20 MG tablet Commonly known as:  CELEXA Take 1 tablet (20 mg total) by mouth daily.   donepezil 5 MG tablet Commonly known as:  ARICEPT TAKE ONE TABLET AT BEDTIME   HYDROcodone-acetaminophen 5-325 MG tablet Commonly known as:  NORCO/VICODIN Take 1 tablet by mouth every 6 (six) hours as needed for moderate pain.   HYDROcodone-acetaminophen 5-325 MG tablet Commonly known as:  NORCO/VICODIN Take 1 tablet by mouth every 6 (six) hours as needed.   HYDROcodone-acetaminophen 5-325 MG tablet Commonly known as:  NORCO/VICODIN Take 1 tablet by mouth every 6 (six) hours as needed for moderate pain.   HYDROcodone-homatropine 5-1.5 MG/5ML syrup Commonly known as:  HYCODAN Take 5 mLs by mouth every 6 (six) hours as needed for cough.   ibuprofen 800 MG tablet Commonly known as:  ADVIL,MOTRIN TAKE  (1)  TABLET  THREE TIMES DAILY.  levofloxacin 750 MG tablet Commonly known as:  LEVAQUIN Take 1 tablet (750 mg total) by mouth daily.   MYRBETRIQ 50 MG Tb24 tablet Generic drug:  mirabegron ER Take 50 mg by mouth daily.   phentermine 37.5 MG tablet Commonly known as:  ADIPEX-P TAKE 1 TABLET EVERY DAY   sulfacetamide 10 % ophthalmic solution Commonly known as:  BLEPH-10 Place 1 drop into both eyes every 4 (four) hours.   traZODone 100 MG tablet Commonly known as:  DESYREL Take 2-3 tablets (200-300 mg total) by mouth at bedtime.          Objective:    BP 130/63   Pulse 98   Temp (!) 97.2 F (36.2 C) (Oral)   Ht 4' 11.5" (1.511 m)   Wt 198 lb (89.8 kg)   BMI 39.32 kg/m     Allergies  Allergen Reactions  . Acetaminophen Other (See Comments)    Tylenol with codeine only - GI UPSET  . Amoxicillin Rash  . Ampicillin Rash  . Codeine Phosphate Nausea Only    Wt Readings from Last 3 Encounters:  07/03/17 198 lb (89.8 kg)  06/24/17 199 lb 6.4 oz (90.4 kg)  05/03/17 207 lb 6.4 oz (94.1 kg)    Physical Exam  Constitutional: She is oriented to person, place, and time. She appears well-developed and well-nourished.  HENT:  Head: Normocephalic and atraumatic.  Right Ear: Tympanic membrane, external ear and ear canal normal.  Left Ear: Tympanic membrane, external ear and ear canal normal.  Nose: Nose normal. No rhinorrhea.  Mouth/Throat: Oropharynx is clear and moist and mucous membranes are normal. No oropharyngeal exudate or posterior oropharyngeal erythema.  Eyes: Pupils are equal, round, and reactive to light. Conjunctivae and EOM are normal.  Neck: Normal range of motion. Neck supple.  Cardiovascular: Normal rate, regular rhythm, normal heart sounds and intact distal pulses.  Pulmonary/Chest: Effort normal and breath sounds normal.  Abdominal: Soft. Bowel sounds are normal.  Neurological: She is alert and oriented to person, place, and time. She has normal reflexes.  Skin: Skin is warm and dry. No rash noted.  Psychiatric: She has a normal mood and affect. Her behavior is normal. Judgment and thought content normal.    Results for orders placed or performed in visit on 09/11/16  Urine Culture  Result Value Ref Range   Urine Culture, Routine Final report (A)    Organism ID, Bacteria Escherichia coli (A)    Antimicrobial Susceptibility Comment   Microscopic Examination  Result Value Ref Range   WBC, UA 0-5 0 - 5 /hpf   RBC, UA None seen 0 - 2 /hpf   Epithelial Cells (non renal) 0-10 0 - 10 /hpf   Renal Epithel, UA None seen None seen /hpf   Bacteria, UA None seen None seen/Few  Urinalysis, Complete  Result Value Ref Range   Specific Gravity, UA  1.025 1.005 - 1.030   pH, UA 5.5 5.0 - 7.5   Color, UA Yellow Yellow   Appearance Ur Clear Clear   Leukocytes, UA Negative Negative   Protein, UA Negative Negative/Trace   Glucose, UA Negative Negative   Ketones, UA Negative Negative   RBC, UA Negative Negative   Bilirubin, UA Negative Negative   Urobilinogen, Ur 0.2 0.2 - 1.0 mg/dL   Nitrite, UA Negative Negative   Microscopic Examination See below:   TSH  Result Value Ref Range   TSH 1.150 0.450 - 4.500 uIU/mL  Lipid Panel  Result Value Ref Range  Cholesterol, Total 223 (H) 100 - 199 mg/dL   Triglycerides 167 (H) 0 - 149 mg/dL   HDL 57 >39 mg/dL   VLDL Cholesterol Cal 33 5 - 40 mg/dL   LDL Calculated 133 (H) 0 - 99 mg/dL   Chol/HDL Ratio 3.9 0.0 - 4.4 ratio  CBC with Differential/Platelet  Result Value Ref Range   WBC 5.8 3.4 - 10.8 x10E3/uL   RBC 4.46 3.77 - 5.28 x10E6/uL   Hemoglobin 14.2 11.1 - 15.9 g/dL   Hematocrit 42.5 34.0 - 46.6 %   MCV 95 79 - 97 fL   MCH 31.8 26.6 - 33.0 pg   MCHC 33.4 31.5 - 35.7 g/dL   RDW 13.7 12.3 - 15.4 %   Platelets 225 150 - 379 x10E3/uL   Neutrophils 49 Not Estab. %   Lymphs 37 Not Estab. %   Monocytes 10 Not Estab. %   Eos 4 Not Estab. %   Basos 0 Not Estab. %   Neutrophils Absolute 2.8 1.4 - 7.0 x10E3/uL   Lymphocytes Absolute 2.1 0.7 - 3.1 x10E3/uL   Monocytes Absolute 0.6 0.1 - 0.9 x10E3/uL   EOS (ABSOLUTE) 0.2 0.0 - 0.4 x10E3/uL   Basophils Absolute 0.0 0.0 - 0.2 x10E3/uL   Immature Granulocytes 0 Not Estab. %   Immature Grans (Abs) 0.0 0.0 - 0.1 x10E3/uL  CMP14+EGFR  Result Value Ref Range   Glucose 92 65 - 99 mg/dL   BUN 18 8 - 27 mg/dL   Creatinine, Ser 0.68 0.57 - 1.00 mg/dL   GFR calc non Af Amer 84 >59 mL/min/1.73   GFR calc Af Amer 97 >59 mL/min/1.73   BUN/Creatinine Ratio 26 12 - 28   Sodium 143 134 - 144 mmol/L   Potassium 4.4 3.5 - 5.2 mmol/L   Chloride 104 96 - 106 mmol/L   CO2 24 20 - 29 mmol/L   Calcium 9.3 8.7 - 10.3 mg/dL   Total Protein 6.6 6.0 -  8.5 g/dL   Albumin 4.4 3.5 - 4.8 g/dL   Globulin, Total 2.2 1.5 - 4.5 g/dL   Albumin/Globulin Ratio 2.0 1.2 - 2.2   Bilirubin Total 0.5 0.0 - 1.2 mg/dL   Alkaline Phosphatase 82 39 - 117 IU/L   AST 21 0 - 40 IU/L   ALT 16 0 - 32 IU/L      Assessment & Plan:   1. Primary osteoarthritis of right knee - HYDROcodone-acetaminophen (NORCO/VICODIN) 5-325 MG tablet; Take 1 tablet by mouth every 6 (six) hours as needed for moderate pain.  Dispense: 120 tablet; Refill: 0 - HYDROcodone-acetaminophen (NORCO/VICODIN) 5-325 MG tablet; Take 1 tablet by mouth every 6 (six) hours as needed.  Dispense: 120 tablet; Refill: 0 - HYDROcodone-acetaminophen (NORCO/VICODIN) 5-325 MG tablet; Take 1 tablet by mouth every 6 (six) hours as needed for moderate pain.  Dispense: 120 tablet; Refill: 0  2. Bronchitis - HYDROcodone-homatropine (HYCODAN) 5-1.5 MG/5ML syrup; Take 5 mLs by mouth every 6 (six) hours as needed for cough.  Dispense: 473 mL; Refill: 0   Continue all other maintenance medications as listed above.  Follow up plan: Recheck 6 months  Educational handout given for [place] PA-C [facility] [address]  [place], [place] [address] [phone]   07/07/2017, 9:44 PM

## 2017-07-11 DIAGNOSIS — I890 Lymphedema, not elsewhere classified: Secondary | ICD-10-CM | POA: Diagnosis not present

## 2017-07-15 ENCOUNTER — Telehealth: Payer: Self-pay | Admitting: Physician Assistant

## 2017-07-15 NOTE — Telephone Encounter (Signed)
Pt aware no samples available today

## 2017-07-18 DIAGNOSIS — I890 Lymphedema, not elsewhere classified: Secondary | ICD-10-CM | POA: Diagnosis not present

## 2017-07-25 ENCOUNTER — Encounter: Payer: Self-pay | Admitting: *Deleted

## 2017-07-25 ENCOUNTER — Ambulatory Visit (INDEPENDENT_AMBULATORY_CARE_PROVIDER_SITE_OTHER): Payer: Medicare Other | Admitting: *Deleted

## 2017-07-25 ENCOUNTER — Other Ambulatory Visit: Payer: Self-pay | Admitting: Physician Assistant

## 2017-07-25 VITALS — BP 127/82 | HR 87 | Ht 59.0 in | Wt 200.0 lb

## 2017-07-25 DIAGNOSIS — Z0000 Encounter for general adult medical examination without abnormal findings: Secondary | ICD-10-CM | POA: Diagnosis not present

## 2017-07-25 MED ORDER — CIPROFLOXACIN HCL 250 MG PO TABS: 250.0000 mg | ORAL_TABLET | Freq: Two times a day (BID) | ORAL | 1 refills | Status: DC

## 2017-07-25 NOTE — Patient Instructions (Addendum)
Come back for 2nd shingles vaccine when you're back from your trip.   [name] , Thank you for taking time to come for your Medicare Wellness Visit. I appreciate your ongoing commitment to your health goals. Please review the following plan we discussed and let me know if I can assist you in the future.   These are the goals we discussed: Goals    . Exercise 3x per week (30 min per time)     Walk for 30 mins 3 times weekly Strengthening exercises        This is a list of the screening recommended for you and due dates:  Health Maintenance  Topic Date Due  . Tetanus Vaccine  04/19/1957  . Flu Shot  10/03/2017  . DEXA scan (bone density measurement)  Completed  . Pneumonia vaccines  Completed

## 2017-07-25 NOTE — Progress Notes (Addendum)
Subjective:   [name] is a 77 y.o. female who presents for an Initial Medicare Annual Wellness Visit. 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 She has 6 children and one of her daughters lives with her during the week. This can be stressful. Her husband has Parkinson's disease and she is is his primary care giver. This is also a cause of stress. She doesn't like to leave him alone but for short periods of time.   Review of Systems    Health is about the same as last year although she does have some increased fatigue.   Cardiac Risk Factors: Advanced age, sedentary lifestyle, obesity     Objective:    Today's Vitals   07/25/17 1125  BP: 127/82  Pulse: 87  Weight: 200 lb (90.7 kg)  Height: 4\' 11"  (1.499 m)   Body mass index is 40.4 kg/m.  Advanced Directives 03/06/2017 07/24/2016 11/14/2015 10/07/2015  Does Patient Have a Medical Advance Directive? Yes Yes Yes Yes  Type of Advance Directive - Living will;Healthcare Power of [place];Out of facility DNR (pink MOST or yellow form) - Living will;Healthcare Power of Attorney  Does patient want to make changes to medical advance directive? - No - Patient declined - No - Patient declined  Copy of [place] in Chart? - No - copy requested - No - copy requested    Current Medications (verified) Outpatient Encounter Medications as of 07/25/2017  Medication Sig  . albuterol (PROVENTIL HFA;VENTOLIN HFA) 108 (90 Base) MCG/ACT inhaler Inhale 2 puffs into the lungs every 6 (six) hours as needed for wheezing or shortness of breath.  Marland Kitchen aspirin EC 81 MG tablet Take 81 mg by mouth daily.  . budesonide-formoterol (SYMBICORT) 160-4.5 MCG/ACT inhaler Inhale 2 puffs into the lungs 2 (two) times daily.  . citalopram (CELEXA) 20 MG tablet Take 1 tablet (20 mg total) by mouth daily.  Marland Kitchen donepezil (ARICEPT) 5 MG tablet TAKE ONE TABLET AT BEDTIME  . HYDROcodone-acetaminophen (NORCO/VICODIN) 5-325 MG tablet Take 1 tablet by mouth every 6 (six)  hours as needed for moderate pain.  Marland Kitchen HYDROcodone-acetaminophen (NORCO/VICODIN) 5-325 MG tablet Take 1 tablet by mouth every 6 (six) hours as needed.  Marland Kitchen HYDROcodone-acetaminophen (NORCO/VICODIN) 5-325 MG tablet Take 1 tablet by mouth every 6 (six) hours as needed for moderate pain.  Marland Kitchen HYDROcodone-homatropine (HYCODAN) 5-1.5 MG/5ML syrup Take 5 mLs by mouth every 6 (six) hours as needed for cough.  Marland Kitchen ibuprofen (ADVIL,MOTRIN) 800 MG tablet TAKE  (1)  TABLET  THREE TIMES DAILY.  Marland Kitchen levofloxacin (LEVAQUIN) 750 MG tablet Take 1 tablet (750 mg total) by mouth daily.  Marland Kitchen MYRBETRIQ 50 MG TB24 tablet Take 50 mg by mouth daily.  . phentermine (ADIPEX-P) 37.5 MG tablet TAKE 1 TABLET EVERY DAY  . sulfacetamide (BLEPH-10) 10 % ophthalmic solution Place 1 drop into both eyes every 4 (four) hours.  . traZODone (DESYREL) 100 MG tablet Take 2-3 tablets (200-300 mg total) by mouth at bedtime.   No facility-administered encounter medications on file as of 07/25/2017.     Allergies (verified) Acetaminophen; Amoxicillin; Ampicillin; and Codeine phosphate   History: Past Medical History:  Diagnosis Date  . Anxiety   . Degeneration of cervical intervertebral disc   . Generalized osteoarthrosis, involving multiple sites   . Radiculopathy, cervical   . Rosacea keratitis   . Varicose veins    Past Surgical History:  Procedure Laterality Date  . APPENDECTOMY    . BACK SURGERY  1997  .  BREAST SURGERY     implants , removed later  . CHOLECYSTECTOMY    . KNEE ARTHROPLASTY Right    07  . SHOULDER ARTHROSCOPY DISTAL CLAVICLE EXCISION AND OPEN ROTATOR CUFF REPAIR Left   . TONSILLECTOMY    . TOTAL KNEE ARTHROPLASTY Left 10/17/2015   Procedure: TOTAL KNEE ARTHROPLASTY;  Surgeon: [name], MD;  Location: [facility];  Service: Orthopedics;  Laterality: Left;   Family History  Problem Relation Age of Onset  . Heart disease Mother   . Early death Mother   . Glaucoma Mother   . Heart disease Father   . Cancer  Sister   . Glaucoma Brother   . Early death Sister   . Glaucoma Brother   . COPD Sister   . Glaucoma Sister   . Seizures Son   . Alcohol abuse Son   . Drug abuse Son    Social History   Socioeconomic History  . Marital status: Married    Spouse name: Not on file  . Number of children: 6  . Years of education: 63  . Highest education level: Associate degree: occupational, Hotel manager, or vocational program  Occupational History  . Occupation: Nursing    Comment: retired  Scientific laboratory technician  . Financial resource strain: Not hard at all  . Food insecurity:    Worry: Never true    Inability: Never true  . Transportation needs:    Medical: No    Non-medical: No  Tobacco Use  . Smoking status: Never Smoker  . Smokeless tobacco: Never Used  Substance and Sexual Activity  . Alcohol use: Yes    Comment: occ  . Drug use: No  . Sexual activity: Not Currently  Lifestyle  . Physical activity:    Days per week: Not on file    Minutes per session: Not on file  . Stress: Rather much  Relationships  . Social connections:    Talks on phone: More than three times a week    Gets together: More than three times a week    Attends religious service: More than 4 times per year    Active member of club or organization: Yes    Attends meetings of clubs or organizations: More than 4 times per year    Relationship status: Married  Other Topics Concern  . Not on file  Social History Narrative  . Not on file    Tobacco Counseling No tobacco use  Clinical Intake:    Nutritional Status: BMI > 30  Obese  What is the last grade level you completed in school?: 7th, GED, nursing degree  Interpreter Needed?: No  Information entered by :: Chong Sicilian, RN   Activities of Daily Living No flowsheet data found.   Immunizations and Health Maintenance Immunization History  Administered Date(s) Administered  . Influenza, High Dose Seasonal PF 03/19/2017  . Pneumococcal Conjugate-13  03/16/2016  . Pneumococcal Polysaccharide-23 03/19/2017  . Zoster Recombinat (Shingrix) 07/17/2016   Health Maintenance Due  Topic Date Due  . Samul Dada  04/19/1959    Patient Care Team: Theodoro Clock as PCP - General (Physician Assistant) [name], MD as Consulting Physician (Orthopedic Surgery) Wilford Corner, MD as Consulting Physician (Gastroenterology)  Indicate any recent Medical Services you may have received from other than [facility] providers in the past year (date may be approximate).     Assessment:   This is a routine wellness examination for [place].  Hearing/Vision screen No exam data present  Dietary issues  and exercise activities discussed:    Goals    . Exercise 3x per week (30 min per time)     Walk for 30 mins 3 times weekly Strengthening exercises       Depression Screen PHQ 2/9 Scores 07/03/2017 06/24/2017 05/03/2017 04/29/2017 01/01/2017 12/24/2016 12/19/2016  PHQ - 2 Score 0 0 0 0 0 0 0    Fall Risk Fall Risk  07/03/2017 06/24/2017 05/03/2017 04/29/2017 01/01/2017  Falls in the past year? No No No No No    Is the patient's home free of loose throw rugs in walkways, pet beds, electrical cords, etc?   yes      Grab bars in the bathroom? no      Handrails on the stairs?   yes      Adequate lighting?   yes   Cognitive Function: MMSE - Mini Mental State Exam 07/25/2017 07/24/2016  Orientation to time 5 3  Orientation to Place 5 5  Registration 3 3  Attention/ Calculation 5 5  Recall 3 3  Language- name 2 objects 2 2  Language- repeat 1 1  Language- follow 3 step command 3 3  Language- read & follow direction 1 1  Write a sentence 1 1  Copy design 1 1  Total score 30 28        Screening Tests Health Maintenance  Topic Date Due  . TETANUS/TDAP  04/19/1959  . INFLUENZA VACCINE  10/03/2017  . DEXA SCAN  Completed  . PNA vac Low Risk Adult  Completed    Qualifies for Shingles Vaccine? Would like to have this once she returns from  her trip  Cancer Screenings: Lung: Low Dose CT Chest recommended if Age 60-80 years, 30 pack-year currently smoking OR have quit w/in 15years. Patient does not qualify. Breast: Up to date on Mammogram? Not indicated Up to date of Bone Density/Dexa? No Colorectal: not indicated    Plan:  Recommended bone density scan Will update Tdap and shingrix when she returns from her trip Keep f/u with PCP Move carefully to avoid falls Take time for yourself to avoid caregiver strain Aim for 150 minutes of moderate activity a week  I have personally reviewed and noted the following in the patient's chart:   . Medical and social history . Use of alcohol, tobacco or illicit drugs  . Current medications and supplements . Functional ability and status . Nutritional status . Physical activity . Advanced directives . List of other physicians . Hospitalizations, surgeries, and ER visits in previous 12 months . Vitals . Screenings to include cognitive, depression, and falls . Referrals and appointments  In addition, I have reviewed and discussed with patient certain preventive protocols, quality metrics, and best practice recommendations. A written personalized care plan for preventive services as well as general preventive health recommendations were provided to patient.     Chong Sicilian, RN   07/25/17   I have reviewed and agree with the above AWV documentation.   Terald Sleeper PA-C [facility] [address]  [place], [place] [address] [phone]

## 2017-07-26 ENCOUNTER — Institutional Professional Consult (permissible substitution): Payer: Medicare Other | Admitting: Internal Medicine

## 2017-08-01 DIAGNOSIS — N1330 Unspecified hydronephrosis: Secondary | ICD-10-CM | POA: Diagnosis not present

## 2017-08-01 DIAGNOSIS — I890 Lymphedema, not elsewhere classified: Secondary | ICD-10-CM | POA: Diagnosis not present

## 2017-08-06 ENCOUNTER — Other Ambulatory Visit: Payer: Self-pay | Admitting: Physician Assistant

## 2017-08-06 DIAGNOSIS — I10 Essential (primary) hypertension: Secondary | ICD-10-CM

## 2017-08-08 ENCOUNTER — Ambulatory Visit: Payer: Medicare Other | Admitting: Internal Medicine

## 2017-08-08 ENCOUNTER — Encounter: Payer: Self-pay | Admitting: Internal Medicine

## 2017-08-08 VITALS — BP 128/78 | HR 72 | Ht 59.0 in | Wt 201.8 lb

## 2017-08-08 DIAGNOSIS — R05 Cough: Secondary | ICD-10-CM | POA: Diagnosis not present

## 2017-08-08 DIAGNOSIS — R053 Chronic cough: Secondary | ICD-10-CM

## 2017-08-08 NOTE — Patient Instructions (Addendum)
ICD-10-CM   1. Chronic cough R05     Likely due to lisinopril Not sure what to make of bronchtic changes on CXR   Plan Stop lisinopril  Followup 3 weeks with APP; if residual cough present will consider nitric oxide testing and CT chest

## 2017-08-08 NOTE — Progress Notes (Signed)
Subjective:    Patient ID: [name], female    DOB: Sep 22, 1940, 77 y.o.   MRN: [id number]  PCP Terald Sleeper, PA-C   HPI  OV 08/08/2017   Chief Complaint  Patient presents with  . Consult    Referred by Dr. Wendi Snipes for a cough.  Pt states she has had a cough x6 months if not longer and even after trying antibiotics and cough meds, pt's cough has not gone away. Pt does cough up yellow mucus and states she has occ. mild SOB and chest tightness.    [name] is a 77 year old obese female with lymphedema non-smoker and a transplant from [place] in [place] who complains of chronic cough.  She says approximately 1 year ago she was started on low-dose lisinopril because of borderline blood pressure issues and approximately now for the last 6 months has had insidious onset of chronic cough that she thinks is getting worse.  Is mild to moderate in intensity.  Associated with occasional wheezing and also made worse by lying down.  Also associated with sputum quality that is occasional rare and clear to yellow.  Albuterol does help the situation.  There is a laryngeal quality to the cough.  There is no fever chills or hemoptysis.  Blood lab review shows normal eosinophil counts and hemoglobin.  She did have a chest x-ray June 24, 2017 that shows bronchitic changes but I myself am not able to discern that upon my personal visualization.  She is also noted to be on Symbicort.  She is not on any nasal steroids or acid reflux medications.  Dr Lorenza Cambridge Reflux Symptom Index (> 13-15 suggestive of LPR cough) 0 -> 5  =  none ->severe problem 08/08/2017   Hoarseness of problem with voice 3  Clearing  Of Throat 1  Excess throat mucus or feeling of post nasal drip 1  Difficulty swallowing food, liquid or tablets 0  Cough after eating or lying down 2  Breathing difficulties or choking episodes 3  Troublesome or annoying cough 4  Sensation of something sticking in throat or lump in throat 0    Heartburn, chest pain, indigestion, or stomach acid coming up 0  TOTAL 14     Results for [name], [name] (MRN [id number]) as of 08/08/2017 09:08  Ref. Range 04/16/2007 09:05 10/07/2015 10:54 10/18/2015 06:50 03/16/2016 10:41 09/11/2016 10:46  Eosinophils Absolute Latest Ref Range: 0.0 - 0.7 K/uL 0.2 0.1        has a past medical history of Anxiety, Degeneration of cervical intervertebral disc, Generalized osteoarthrosis, involving multiple sites, Radiculopathy, cervical, Rosacea keratitis, and Varicose veins.   reports that she has never smoked. She has never used smokeless tobacco.  Past Surgical History:  Procedure Laterality Date  . APPENDECTOMY    . BACK SURGERY  1997  . BREAST SURGERY     implants , removed later  . CHOLECYSTECTOMY    . KNEE ARTHROPLASTY Right    07  . SHOULDER ARTHROSCOPY DISTAL CLAVICLE EXCISION AND OPEN ROTATOR CUFF REPAIR Left   . TONSILLECTOMY    . TOTAL KNEE ARTHROPLASTY Left 10/17/2015   Procedure: TOTAL KNEE ARTHROPLASTY;  Surgeon: [name], MD;  Location: [facility];  Service: Orthopedics;  Laterality: Left;    Allergies  Allergen Reactions  . Acetaminophen Other (See Comments)    Tylenol with codeine only - GI UPSET  . Amoxicillin Rash  . Ampicillin Rash  . Codeine Phosphate Nausea Only    Immunization History  Administered Date(s) Administered  . Influenza, High Dose Seasonal PF 03/19/2017  . Pneumococcal Conjugate-13 03/16/2016  . Pneumococcal Polysaccharide-23 03/19/2017  . Zoster Recombinat (Shingrix) 07/17/2016    Family History  Problem Relation Age of Onset  . Heart disease Mother   . Early death Mother   . Glaucoma Mother   . Heart disease Father   . Cancer Sister   . Glaucoma Brother   . Early death Sister   . Glaucoma Brother   . COPD Sister   . Glaucoma Sister   . Seizures Son   . Alcohol abuse Son   . Drug abuse Son      Current Outpatient Medications:  .  albuterol (PROVENTIL HFA;VENTOLIN HFA) 108 (90 Base) MCG/ACT  inhaler, Inhale 2 puffs into the lungs every 6 (six) hours as needed for wheezing or shortness of breath., Disp: 1 Inhaler, Rfl: 5 .  aspirin EC 81 MG tablet, Take 81 mg by mouth daily., Disp: , Rfl:  .  budesonide-formoterol (SYMBICORT) 160-4.5 MCG/ACT inhaler, Inhale 2 puffs into the lungs 2 (two) times daily., Disp: 1 Inhaler, Rfl: 2 .  citalopram (CELEXA) 20 MG tablet, Take 1 tablet (20 mg total) by mouth daily., Disp: 90 tablet, Rfl: 3 .  donepezil (ARICEPT) 5 MG tablet, TAKE ONE TABLET AT BEDTIME, Disp: 90 tablet, Rfl: 0 .  furosemide (LASIX) 20 MG tablet, Take 20 mg by mouth as needed., Disp: , Rfl:  .  HYDROcodone-acetaminophen (NORCO/VICODIN) 5-325 MG tablet, Take 1 tablet by mouth every 6 (six) hours as needed for moderate pain., Disp: 120 tablet, Rfl: 0 .  HYDROcodone-homatropine (HYCODAN) 5-1.5 MG/5ML syrup, Take 5 mLs by mouth every 6 (six) hours as needed for cough., Disp: 473 mL, Rfl: 0 .  ibuprofen (ADVIL,MOTRIN) 800 MG tablet, TAKE  (1)  TABLET  THREE TIMES DAILY., Disp: 90 tablet, Rfl: 2 .  lisinopril (PRINIVIL,ZESTRIL) 5 MG tablet, Take 5 mg by mouth daily., Disp: , Rfl:  .  MYRBETRIQ 50 MG TB24 tablet, Take 50 mg by mouth daily., Disp: , Rfl: 0 .  pravastatin (PRAVACHOL) 20 MG tablet, Take 20 mg by mouth daily., Disp: , Rfl:  .  traZODone (DESYREL) 100 MG tablet, Take 2-3 tablets (200-300 mg total) by mouth at bedtime., Disp: 270 tablet, Rfl: 3 .  phentermine (ADIPEX-P) 37.5 MG tablet, TAKE 1 TABLET EVERY DAY (Patient not taking: Reported on 08/08/2017), Disp: 30 tablet, Rfl: 0   Review of Systems  Constitutional: Negative for fever and unexpected weight change.  HENT: Positive for congestion and sinus pressure. Negative for dental problem, ear pain, nosebleeds, postnasal drip, rhinorrhea, sneezing, sore throat and trouble swallowing.   Eyes: Negative for redness and itching.  Respiratory: Positive for cough, chest tightness and shortness of breath. Negative for wheezing.     Cardiovascular: Positive for palpitations and leg swelling.  Gastrointestinal: Negative for nausea and vomiting.  Genitourinary: Negative for dysuria.  Musculoskeletal: Positive for joint swelling.  Skin: Negative for rash.  Allergic/Immunologic: Negative.  Negative for environmental allergies, food allergies and immunocompromised state.  Neurological: Negative for headaches.  Hematological: Does not bruise/bleed easily.  Psychiatric/Behavioral: Negative for dysphoric mood. The patient is not nervous/anxious.        Objective:   Physical Exam  Constitutional: She is oriented to person, place, and time. She appears well-developed and well-nourished. No distress.  Obese lady  HENT:  Head: Normocephalic and atraumatic.  Right Ear: External ear normal.  Left Ear: External ear normal.  Mouth/Throat: Oropharynx is clear  and moist. No oropharyngeal exudate.  Laryngeal cough x1 in the office  Eyes: Pupils are equal, round, and reactive to light. Conjunctivae and EOM are normal. Right eye exhibits no discharge. Left eye exhibits no discharge. No scleral icterus.  Neck: Normal range of motion. Neck supple. No JVD present. No tracheal deviation present. No thyromegaly present.  Cardiovascular: Normal rate, regular rhythm, normal heart sounds and intact distal pulses. Exam reveals no gallop and no friction rub.  No murmur heard. Pulmonary/Chest: Effort normal and breath sounds normal. No respiratory distress. She has no wheezes. She has no rales. She exhibits no tenderness.  No wheezing no crackles  Abdominal: Soft. Bowel sounds are normal. She exhibits no distension and no mass. There is no tenderness. There is no rebound and no guarding.  Musculoskeletal: Normal range of motion. She exhibits no edema or tenderness.  Chronic lymphedema  Lymphadenopathy:    She has no cervical adenopathy.  Neurological: She is alert and oriented to person, place, and time. She has normal reflexes. No cranial  nerve deficit. She exhibits normal muscle tone. Coordination normal.  Skin: Skin is warm and dry. No rash noted. She is not diaphoretic. No erythema. No pallor.  Psychiatric: She has a normal mood and affect. Her behavior is normal. Judgment and thought content normal.  Vitals reviewed.   Today's Vitals   08/08/17 0903  BP: 128/78  Pulse: 72  SpO2: 94%  Weight: 201 lb 12.8 oz (91.5 kg)  Height: 4\' 11"  (1.499 m)    Estimated body mass index is 40.76 kg/m as calculated from the following:   Height as of this encounter: 4\' 11"  (1.499 m).   Weight as of this encounter: 201 lb 12.8 oz (91.5 kg).       Assessment & Plan:     ICD-10-CM   1. Chronic cough R05     Likely due to lisinopril Not sure what to make of bronchtic changes on CXR but possible asthma exists. Acid reflux might be playing a role in the cough but currently unclear  Plan Stop lisinopril  Followup 3 weeks with APP; if residual cough present will consider nitric oxide testing and CT chest +/-acid reflux therapy    Dr. Brand Males, M.D., [facility].C.P Pulmonary and Critical Care Medicine Staff Physician, [place] Director - Interstitial Lung Disease  Program  Pulmonary [place] [facility] [place], [place], [address]  Pager: [phone], If no answer or between  15:00h - 7:00h: call [phone] Telephone: [phone]

## 2017-08-15 ENCOUNTER — Other Ambulatory Visit: Payer: Self-pay | Admitting: Physician Assistant

## 2017-08-15 DIAGNOSIS — F339 Major depressive disorder, recurrent, unspecified: Secondary | ICD-10-CM

## 2017-08-22 DIAGNOSIS — I890 Lymphedema, not elsewhere classified: Secondary | ICD-10-CM | POA: Diagnosis not present

## 2017-08-27 NOTE — Progress Notes (Signed)
History of Present Illness [name] is a 77 y.o. female never smoker with chronic cough since starting low dose Lisinopril one year ago. She also has a history of bronchitis.  She was seen x 1 for consult by Dr. Chase Caller on 08/08/2017 for cough.  [name] is a 77 year old obese female with lymphedema non-smoker and a transplant from [place] in [place] who complains of chronic cough.  She says approximately 1 year ago she was started on low-dose lisinopril because of borderline blood pressure issues and approximately now for the last 6 months has had insidious onset of chronic cough that she thinks is getting worse.  Is mild to moderate in intensity.  Associated with occasional wheezing and also made worse by lying down.  Also associated with sputum quality that is occasional rare and clear to yellow.  Albuterol does help the situation.  There is a laryngeal quality to the cough.  There is no fever chills or hemoptysis.  Blood lab review shows normal eosinophil counts and hemoglobin.  She did have a chest x-ray June 24, 2017 that shows bronchitic changes .   She is  on Symbicort.  She is not on any nasal steroids or acid reflux medications.   08/28/2017  2 week follow up after stopping Lisinopril 2/2 cough. Pt. Presents for follow up. She stopped taking her Lisinopril after seeing Dr. Chase Caller on 6/6.She  states her cough is better but it is not gone. Dr. Chase Caller felt that her cough was related to her Lisinopril. She states cough is non-productive.No purulent secretions. No fever. She states there may be a slight worsening in her cough when she is laying down or reclined. She is not taking any PPI. She is not taking any non-sedating antihistamine. She denies fever, chest pain, orthopnea or hemoptysis.  Test Results: FENO: 10 PPB CXR 06/2017>> IMPRESSION: Mild chronic bronchitic changes with RIGHT upper lobe scarring.  No acute abnormalities.  CBC Latest Ref Rng & Units  09/11/2016 03/16/2016 10/18/2015  WBC 3.4 - 10.8 x10E3/uL 5.8 5.5 10.5  Hemoglobin 11.1 - 15.9 g/dL 14.2 14.0 11.9(L)  Hematocrit 34.0 - 46.6 % 42.5 42.3 36.4  Platelets 150 - 379 x10E3/uL 225 231 174    BMP Latest Ref Rng & Units 09/11/2016 03/16/2016 10/18/2015  Glucose 65 - 99 mg/dL 92 91 125(H)  BUN 8 - 27 mg/dL 18 16 15   Creatinine 0.57 - 1.00 mg/dL 0.68 0.81 0.75  BUN/Creat Ratio 12 - 28 26 20  -  Sodium 134 - 144 mmol/L 143 140 139  Potassium 3.5 - 5.2 mmol/L 4.4 4.4 4.5  Chloride 96 - 106 mmol/L 104 101 103  CO2 20 - 29 mmol/L 24 23 29   Calcium 8.7 - 10.3 mg/dL 9.3 9.3 9.0    BNP No results found for: BNP  ProBNP No results found for: PROBNP  PFT No results found for: FEV1PRE, FEV1POST, FVCPRE, FVCPOST, TLC, DLCOUNC, PREFEV1FVCRT, PSTFEV1FVCRT  No results found.   Past medical hx Past Medical History:  Diagnosis Date  . Anxiety   . Degeneration of cervical intervertebral disc   . Generalized osteoarthrosis, involving multiple sites   . Radiculopathy, cervical   . Rosacea keratitis   . Varicose veins      Social History   Tobacco Use  . Smoking status: Never Smoker  . Smokeless tobacco: Never Used  Substance Use Topics  . Alcohol use: Yes    Comment: occ  . Drug use: No    [name] reports that she  has never smoked. She has never used smokeless tobacco. She reports that she drinks alcohol. She reports that she does not use drugs.  Tobacco Cessation: Counseling given: Not Answered   Past surgical hx, Family hx, Social hx all reviewed.  Current Outpatient Medications on File Prior to Visit  Medication Sig  . albuterol (PROVENTIL HFA;VENTOLIN HFA) 108 (90 Base) MCG/ACT inhaler Inhale 2 puffs into the lungs every 6 (six) hours as needed for wheezing or shortness of breath.  Marland Kitchen aspirin EC 81 MG tablet Take 81 mg by mouth daily.  . budesonide-formoterol (SYMBICORT) 160-4.5 MCG/ACT inhaler Inhale 2 puffs into the lungs 2 (two) times daily.  . citalopram  (CELEXA) 20 MG tablet TAKE 1 TABLET DAILY  . donepezil (ARICEPT) 5 MG tablet TAKE ONE TABLET AT BEDTIME  . furosemide (LASIX) 20 MG tablet Take 20 mg by mouth as needed.  Marland Kitchen ibuprofen (ADVIL,MOTRIN) 800 MG tablet TAKE  (1)  TABLET  THREE TIMES DAILY.  . MYRBETRIQ 50 MG TB24 tablet Take 50 mg by mouth daily.  . phentermine (ADIPEX-P) 37.5 MG tablet TAKE 1 TABLET EVERY DAY  . pravastatin (PRAVACHOL) 20 MG tablet Take 20 mg by mouth daily.  . traZODone (DESYREL) 100 MG tablet Take 2-3 tablets (200-300 mg total) by mouth at bedtime.   No current facility-administered medications on file prior to visit.      Allergies  Allergen Reactions  . Acetaminophen Other (See Comments)    Tylenol with codeine only - GI UPSET  . Amoxicillin Rash  . Ampicillin Rash  . Codeine Phosphate Nausea Only    Review Of Systems:  Constitutional:   No  weight loss, night sweats,  Fevers, chills, fatigue, or  lassitude.  HEENT:   No headaches,  Difficulty swallowing,  Tooth/dental problems, or  Sore throat,                No sneezing, itching, ear ache, nasal congestion, post nasal drip,   CV:  No chest pain,  Orthopnea, PND, swelling in lower extremities, anasarca, dizziness, palpitations, syncope.   GI  No heartburn, indigestion, abdominal pain, nausea, vomiting, diarrhea, change in bowel habits, loss of appetite, bloody stools.   Resp: No shortness of breath with exertion or at rest.  No excess mucus, no productive cough,  No non-productive cough,  No coughing up of blood.  No change in color of mucus.  No wheezing.  No chest wall deformity  Skin: no rash or lesions.  GU: no dysuria, change in color of urine, no urgency or frequency.  No flank pain, no hematuria   MS:  No joint pain or swelling.  No decreased range of motion.  No back pain.  Psych:  No change in mood or affect. No depression or anxiety.  No memory loss.  Vital Signs BP 118/84 (BP Location: Left Arm, Patient Position: Sitting, Cuff  Size: Normal)   Pulse 82   Ht 4\' 11"  (1.499 m)   Wt 200 lb 8 oz (90.9 kg)   SpO2 96%   BMI 40.50 kg/m    Physical Exam:  General- No distress,  A&Ox3, pleasant ENT: No sinus tenderness, TM clear, pale nasal mucosa, no oral exudate,+ post nasal drip, no LAN, laryngeal cough Cardiac: S1, S2, regular rate and rhythm, no murmur Chest: No wheeze/ rales/ dullness; no accessory muscle use, no nasal flaring, no sternal retractions, diminished per bases Abd.: Soft Non-tender, obese, BS +, ND Ext: No clubbing cyanosis, chronic lymphedema BLE Neuro:  normal strength,  MAE x 4, A&O x 3 Skin: No rashes, warm and dry, changes of venous stasis. Psych: normal mood and behavior   Assessment/Plan  Chronic cough Laryngeal cough, no wheezing Some better off lisinopril, but not gone. Not on PPI Not on Non-sedating Anti-histamines Plan: Prednisone taper; 10 mg tablets: 4 tabs x 2 days, 3 tabs x 2 days, 2 tabs x 2 days 1 tab x 2 days then stop. We will add omeprazole 20 mg at bedtime x 4 weeks. GERD Diet Sips of water instead of throat clearing Sugar Free Jolly Ranchers or Werther's originals for throat soothing. Delsym Cough syrup 5 cc's every 12 hours Non-sedating antihistamine of your choice daily ( Zyrtec, Allegra, Xyzol, Claritin ( Generic ok) Follow up with Dr. Chase Caller or Odeth Bry NP in 4 weeks. Please contact office for sooner follow up if symptoms do not improve or worsen or seek emergency care  Will need CT Chest at follow up if cough has not resolved. Consider PFT's at follow up.     Magdalen Spatz, NP 08/28/2017  2:54 PM

## 2017-08-28 ENCOUNTER — Encounter: Payer: Self-pay | Admitting: Acute Care

## 2017-08-28 ENCOUNTER — Ambulatory Visit: Payer: Medicare Other | Admitting: Acute Care

## 2017-08-28 VITALS — BP 118/84 | HR 82 | Ht 59.0 in | Wt 200.5 lb

## 2017-08-28 DIAGNOSIS — R053 Chronic cough: Secondary | ICD-10-CM

## 2017-08-28 DIAGNOSIS — R05 Cough: Secondary | ICD-10-CM

## 2017-08-28 LAB — NITRIC OXIDE: NITRIC OXIDE: 10

## 2017-08-28 MED ORDER — PREDNISONE 10 MG PO TABS: ORAL_TABLET | ORAL | 0 refills | Status: DC

## 2017-08-28 MED ORDER — OMEPRAZOLE 20 MG PO CPDR: 20.0000 mg | DELAYED_RELEASE_CAPSULE | Freq: Every day | ORAL | 0 refills | Status: DC

## 2017-08-28 NOTE — Patient Instructions (Addendum)
Its nice to meet you.  Prednisone taper; 10 mg tablets: 4 tabs x 2 days, 3 tabs x 2 days, 2 tabs x 2 days 1 tab x 2 days then stop. We will add omeprazole 20 mg at bedtime x 4 weeks. GERD Diet Sips of water instead of throat clearing Sugar Free Jolly Ranchers or Werther's originals for throat soothing. Delsym Cough syrup 5 cc's every 12 hours Non-sedating antihistamine of your choice daily ( Zyrtec, Allegra, Xyzol, Claritin ( Generic ok) Follow up with Dr. Chase Caller or Candid Bovey NP in 4 weeks. Please contact office for sooner follow up if symptoms do not improve or worsen or seek emergency care  Will need CT Chest at follow up if cough has not resolved. Consider PFT's at follow up.

## 2017-08-28 NOTE — Assessment & Plan Note (Signed)
Laryngeal cough, no wheezing Some better off lisinopril, but not gone. Not on PPI Not on Non-sedating Anti-histamines Plan: Prednisone taper; 10 mg tablets: 4 tabs x 2 days, 3 tabs x 2 days, 2 tabs x 2 days 1 tab x 2 days then stop. We will add omeprazole 20 mg at bedtime x 4 weeks. GERD Diet Sips of water instead of throat clearing Sugar Free Jolly Ranchers or Werther's originals for throat soothing. Delsym Cough syrup 5 cc's every 12 hours Non-sedating antihistamine of your choice daily ( Zyrtec, Allegra, Xyzol, Claritin ( Generic ok) Follow up with Dr. Chase Caller or Darice Vicario NP in 4 weeks. Please contact office for sooner follow up if symptoms do not improve or worsen or seek emergency care  Will need CT Chest at follow up if cough has not resolved. Consider PFT's at follow up.

## 2017-09-03 ENCOUNTER — Other Ambulatory Visit: Payer: Self-pay | Admitting: Physician Assistant

## 2017-09-03 DIAGNOSIS — M1711 Unilateral primary osteoarthritis, right knee: Secondary | ICD-10-CM

## 2017-09-03 DIAGNOSIS — I890 Lymphedema, not elsewhere classified: Secondary | ICD-10-CM | POA: Diagnosis not present

## 2017-09-16 ENCOUNTER — Encounter: Payer: Self-pay | Admitting: Pediatrics

## 2017-09-16 ENCOUNTER — Ambulatory Visit (INDEPENDENT_AMBULATORY_CARE_PROVIDER_SITE_OTHER): Payer: Medicare Other | Admitting: Pediatrics

## 2017-09-16 VITALS — BP 115/75 | HR 100 | Temp 97.8°F | Ht 59.0 in | Wt 201.4 lb

## 2017-09-16 DIAGNOSIS — R197 Diarrhea, unspecified: Secondary | ICD-10-CM

## 2017-09-16 DIAGNOSIS — R110 Nausea: Secondary | ICD-10-CM | POA: Diagnosis not present

## 2017-09-16 DIAGNOSIS — R109 Unspecified abdominal pain: Secondary | ICD-10-CM

## 2017-09-16 DIAGNOSIS — R399 Unspecified symptoms and signs involving the genitourinary system: Secondary | ICD-10-CM | POA: Diagnosis not present

## 2017-09-16 LAB — MICROSCOPIC EXAMINATION: RENAL EPITHEL UA: NONE SEEN /HPF

## 2017-09-16 LAB — URINALYSIS, COMPLETE
BILIRUBIN UA: NEGATIVE
GLUCOSE, UA: NEGATIVE
Nitrite, UA: NEGATIVE
SPEC GRAV UA: 1.02 (ref 1.005–1.030)
UUROB: 4 mg/dL — AB (ref 0.2–1.0)
pH, UA: 6 (ref 5.0–7.5)

## 2017-09-16 MED ORDER — ONDANSETRON 4 MG PO TBDP: 4.0000 mg | ORAL_TABLET | Freq: Two times a day (BID) | ORAL | 0 refills | Status: DC | PRN

## 2017-09-16 NOTE — Progress Notes (Signed)
  Subjective:   Patient ID: [name], female    DOB: 1940/03/18, 77 y.o.   MRN: [id number] CC: Diarrhea and Lower abdominal pain  HPI: [name] is a 77 y.o. female   Diarrhea started 4 days ago, also feeling nausea. No fevers. Not eating anything past few days. Trying to drink fluids. Every 20 min diarrhea the first day, about 6 times today so far.  No blood in stool.  Stools with more consistency today compared to last couple days.  Cramping in her lower abdomen no dysuria.  Has not taken her medicines for last 2 days because she is been nauseous.  No vomiting.    Relevant past medical, surgical, family and social history reviewed. Allergies and medications reviewed and updated. Social History   Tobacco Use  Smoking Status Never Smoker  Smokeless Tobacco Never Used   ROS: Per HPI   Objective:    BP 115/75   Pulse 100   Temp 97.8 F (36.6 C) (Oral)   Ht _0  (1.499 m)   Wt 201 lb 6.4 oz (91.4 kg)   BMI 40.68 kg/m   Wt Readings from Last 3 Encounters:  09/16/17 201 lb 6.4 oz (91.4 kg)  08/28/17 200 lb 8 oz (90.9 kg)  08/08/17 201 lb 12.8 oz (91.5 kg)    Gen: NAD, alert, cooperative with exam, NCAT EYES: EOMI, no conjunctival injection, or no icterus CV: NRRR, normal S1/S2, no murmur, distal pulses 2+ b/l Resp: CTABL, no wheezes, normal WOB Abd: +BS, soft, mildly tender with palpation throughout.  Nondistended.  No guarding.  No rebound.   Ext: No edema, warm Neuro: Alert and oriented MSK: normal muscle bulk  Assessment & Plan:  [place] was seen today for diarrhea and lower abdominal pain.  Diagnoses and all orders for this visit:  Abdominal pain, unspecified abdominal location Follow-up blood work.  If any worsening pain will get imaging. -     CBC with Differential/Platelet -     CMP14+EGFR  UTI symptoms Few white cells in urine, mostly symptoms seem related to gastroenteritis.  Follow-up urine culture. -     Urinalysis, Complete -     Urine  Culture; Future -     Urine Culture  Nausea -     ondansetron (ZOFRAN-ODT) 4 MG disintegrating tablet; Take 1 tablet (4 mg total) by mouth every 12 (twelve) hours as needed for nausea or vomiting.  Diarrhea, unspecified type Continue supportive care, pushing fluids.  Discussed return precautions.  Follow up plan: Return if symptoms worsen or fail to improve. Assunta Found, MD [place]

## 2017-09-17 LAB — CBC WITH DIFFERENTIAL/PLATELET
BASOS: 0 %
Basophils Absolute: 0 10*3/uL (ref 0.0–0.2)
EOS (ABSOLUTE): 0 10*3/uL (ref 0.0–0.4)
Eos: 0 %
HEMOGLOBIN: 13.3 g/dL (ref 11.1–15.9)
Hematocrit: 39.3 % (ref 34.0–46.6)
Immature Grans (Abs): 0 10*3/uL (ref 0.0–0.1)
Immature Granulocytes: 0 %
LYMPHS ABS: 1.7 10*3/uL (ref 0.7–3.1)
Lymphs: 13 %
MCH: 32.4 pg (ref 26.6–33.0)
MCHC: 33.8 g/dL (ref 31.5–35.7)
MCV: 96 fL (ref 79–97)
MONOCYTES: 13 %
MONOS ABS: 1.7 10*3/uL — AB (ref 0.1–0.9)
NEUTROS ABS: 10 10*3/uL — AB (ref 1.4–7.0)
Neutrophils: 74 %
Platelets: 246 10*3/uL (ref 150–450)
RBC: 4.11 x10E6/uL (ref 3.77–5.28)
RDW: 13.2 % (ref 12.3–15.4)
WBC: 13.5 10*3/uL — AB (ref 3.4–10.8)

## 2017-09-17 LAB — CMP14+EGFR
A/G RATIO: 1.5 (ref 1.2–2.2)
ALBUMIN: 4 g/dL (ref 3.5–4.8)
ALT: 40 IU/L — ABNORMAL HIGH (ref 0–32)
AST: 23 IU/L (ref 0–40)
Alkaline Phosphatase: 92 IU/L (ref 39–117)
BUN/Creatinine Ratio: 19 (ref 12–28)
BUN: 11 mg/dL (ref 8–27)
Bilirubin Total: 1.2 mg/dL (ref 0.0–1.2)
CHLORIDE: 99 mmol/L (ref 96–106)
CO2: 25 mmol/L (ref 20–29)
CREATININE: 0.59 mg/dL (ref 0.57–1.00)
Calcium: 9.1 mg/dL (ref 8.7–10.3)
GFR calc Af Amer: 101 mL/min/{1.73_m2} (ref >59)
GFR calc non Af Amer: 87 mL/min/{1.73_m2} (ref >59)
GLOBULIN, TOTAL: 2.7 g/dL (ref 1.5–4.5)
Glucose: 119 mg/dL — ABNORMAL HIGH (ref 65–99)
POTASSIUM: 3.8 mmol/L (ref 3.5–5.2)
Sodium: 138 mmol/L (ref 134–144)
Total Protein: 6.7 g/dL (ref 6.0–8.5)

## 2017-09-18 ENCOUNTER — Telehealth: Payer: Self-pay | Admitting: Physician Assistant

## 2017-09-19 NOTE — Telephone Encounter (Signed)
Discussed with patient.  She still has some abdominal discomfort if she goes for greater than 4 hours without Tylenol.  Pain overall she thinks is better.  The diarrhea has improved.  She has some solid stool in the next time she might have a loose stool.  She feels very sore in her genital area  she thinks from the diarrhea.  She does not think she is having any dysuria..  Urine is growing E. coli and Proteus, she has Cipro at home and asked if she could go ahead instead.  Discussed okay to start  250 mg of Cipro twice daily for the next 3 days.  If we need to change alternate antibiotic after sensitivities returned we will call her back.

## 2017-09-19 NOTE — Telephone Encounter (Signed)
Dr. Evette Doffing did them on 7/15

## 2017-09-19 NOTE — Telephone Encounter (Signed)
Please review and advise.

## 2017-09-20 LAB — URINE CULTURE

## 2017-09-25 ENCOUNTER — Ambulatory Visit: Payer: Medicare Other | Admitting: Acute Care

## 2017-09-25 ENCOUNTER — Encounter: Payer: Self-pay | Admitting: Acute Care

## 2017-09-25 DIAGNOSIS — R053 Chronic cough: Secondary | ICD-10-CM

## 2017-09-25 DIAGNOSIS — R05 Cough: Secondary | ICD-10-CM

## 2017-09-25 DIAGNOSIS — G8929 Other chronic pain: Secondary | ICD-10-CM | POA: Diagnosis not present

## 2017-09-25 DIAGNOSIS — Z9181 History of falling: Secondary | ICD-10-CM | POA: Diagnosis not present

## 2017-09-25 DIAGNOSIS — Z9889 Other specified postprocedural states: Secondary | ICD-10-CM | POA: Diagnosis not present

## 2017-09-25 DIAGNOSIS — R059 Cough, unspecified: Secondary | ICD-10-CM

## 2017-09-25 DIAGNOSIS — M25511 Pain in right shoulder: Secondary | ICD-10-CM | POA: Diagnosis not present

## 2017-09-25 MED ORDER — OMEPRAZOLE 20 MG PO CPDR: 20.0000 mg | DELAYED_RELEASE_CAPSULE | Freq: Every day | ORAL | 2 refills | Status: DC

## 2017-09-25 NOTE — Patient Instructions (Addendum)
It is nice to see you again today. We will make sure you have refills for your Prilosec.( Send in a prescription) Continue Symbicort 2 puffs twice daily Rinse mouth after use. Continue  omeprazole 20 mg at bedtime x 2 additional months. GERD Diet Sips of water instead of throat clearing Sugar Free Jolly Ranchers or Werther's originals for throat soothing. Delsym Cough syrup 5 cc's every 12 hours Continue Non-sedating antihistamine of your choice daily ( Zyrtec, Allegra, Xyzol, Claritin ( Generic ok) Will order a  CT Chest without contrast for cough. We will schedule  PFT's  Follow up with Dr. Chase Caller or Judson Roch NP in 8 weeks Please contact office for sooner follow up if symptoms do not improve or worsen or seek emergency care

## 2017-09-25 NOTE — Progress Notes (Signed)
History of Present Illness [name] is a 77 y.o. female with never smoker with chronic cough since starting low dose Lisinopril one year ago. She also has a history of bronchitis.  She was seen x 1 for consult by Dr. Chase Caller on 08/08/2017 for cough.  Jaime Kimballis a 77 year old obese female with lymphedema non-smoker and a transplant from [place] in [place] who complains of chronic cough. She says approximately 1 year ago she was started on low-dose lisinopril because of borderline blood pressure issues and approximately now for the last 6 months has had insidious onset of chronic cough that she thinks is getting worse. Is mild to moderate in intensity. Associated with occasional wheezing and also made worse by lying down. Also associated with sputum quality that is occasional rare and clear to yellow. Albuterol does help the situation. There is a laryngeal quality to the cough. There is no fever chills or hemoptysis. Blood lab review shows normal eosinophil counts and hemoglobin. She did have a chest x-ray June 24, 2017 that shows bronchitic changes .  She is  on Symbicort. She is not on any nasal steroids or acid reflux medications.   09/25/2017  Follow up: Pt. Presents for follow up. She was seen for chronic cough on 08/08/2017. At the time she was on Lisinopril at the time. Dr. Chase Caller stopped the lisinopril, and cough was better but not gone when she returned for follow up on 08/28/2017. We added prednisone taper,Delsym,  omeprazole, GERD diet and non-sedating antihistamine. She returns today for follow up.She states she was compliant with her prednisone taper.She states her cough is better. She says she has been under a lot of stress since November due to family deaths. They have affected she body aches. She has been started on Flexoril.She does notice that her cough is worse after bending over. She has not been following the GERD diet lately. We have reinforced  following the diet. She does note yellow secretions at times She has added Zyrtec to her rigimen and she feels this has helped. She did not have to use the night time cough syrup. She denied fever, cjhest pain, orthopnea or hemoptysis.She states she has not had to use her rescue inhaler.   Test Results: FENO:08/28/2017>> 10 PPB  CXR 06/2017>> IMPRESSION: Mild chronic bronchitic changes with RIGHT upper lobe scarring. No acute abnormalities   CBC Latest Ref Rng & Units 09/16/2017 09/11/2016 03/16/2016  WBC 3.4 - 10.8 x10E3/uL 13.5(H) 5.8 5.5  Hemoglobin 11.1 - 15.9 g/dL 13.3 14.2 14.0  Hematocrit 34.0 - 46.6 % 39.3 42.5 42.3  Platelets 150 - 450 x10E3/uL 246 225 231    BMP Latest Ref Rng & Units 09/16/2017 09/11/2016 03/16/2016  Glucose 65 - 99 mg/dL 119(H) 92 91  BUN 8 - 27 mg/dL 11 18 16   Creatinine 0.57 - 1.00 mg/dL 0.59 0.68 0.81  BUN/Creat Ratio 12 - 28 19 26 20   Sodium 134 - 144 mmol/L 138 143 140  Potassium 3.5 - 5.2 mmol/L 3.8 4.4 4.4  Chloride 96 - 106 mmol/L 99 104 101  CO2 20 - 29 mmol/L 25 24 23   Calcium 8.7 - 10.3 mg/dL 9.1 9.3 9.3    BNP No results found for: BNP  ProBNP No results found for: PROBNP  PFT No results found for: FEV1PRE, FEV1POST, FVCPRE, FVCPOST, TLC, DLCOUNC, PREFEV1FVCRT, PSTFEV1FVCRT  No results found.   Past medical hx Past Medical History:  Diagnosis Date  . Anxiety   . Degeneration of cervical intervertebral  disc   . Generalized osteoarthrosis, involving multiple sites   . Radiculopathy, cervical   . Rosacea keratitis   . Varicose veins      Social History   Tobacco Use  . Smoking status: Never Smoker  . Smokeless tobacco: Never Used  Substance Use Topics  . Alcohol use: Yes    Comment: occ  . Drug use: No    Ms.Peacock reports that she has never smoked. She has never used smokeless tobacco. She reports that she drinks alcohol. She reports that she does not use drugs.  Tobacco Cessation: Never smoker  Past surgical hx,  Family hx, Social hx all reviewed.  Current Outpatient Medications on File Prior to Visit  Medication Sig  . albuterol (PROVENTIL HFA;VENTOLIN HFA) 108 (90 Base) MCG/ACT inhaler Inhale 2 puffs into the lungs every 6 (six) hours as needed for wheezing or shortness of breath.  Marland Kitchen aspirin EC 81 MG tablet Take 81 mg by mouth daily.  . budesonide-formoterol (SYMBICORT) 160-4.5 MCG/ACT inhaler Inhale 2 puffs into the lungs 2 (two) times daily.  . citalopram (CELEXA) 20 MG tablet TAKE 1 TABLET DAILY  . donepezil (ARICEPT) 5 MG tablet TAKE ONE TABLET AT BEDTIME  . furosemide (LASIX) 20 MG tablet Take 20 mg by mouth as needed.  Marland Kitchen ibuprofen (ADVIL,MOTRIN) 800 MG tablet TAKE  (1)  TABLET  THREE TIMES DAILY.  . MYRBETRIQ 50 MG TB24 tablet Take 50 mg by mouth daily.  . ondansetron (ZOFRAN-ODT) 4 MG disintegrating tablet Take 1 tablet (4 mg total) by mouth every 12 (twelve) hours as needed for nausea or vomiting.  . phentermine (ADIPEX-P) 37.5 MG tablet TAKE 1 TABLET EVERY DAY  . pravastatin (PRAVACHOL) 20 MG tablet Take 20 mg by mouth daily.  . traZODone (DESYREL) 100 MG tablet Take 2-3 tablets (200-300 mg total) by mouth at bedtime.   No current facility-administered medications on file prior to visit.      Allergies  Allergen Reactions  . Acetaminophen Other (See Comments)    Tylenol with codeine only - GI UPSET  . Amoxicillin Rash  . Ampicillin Rash  . Codeine Phosphate Nausea Only    Review Of Systems:  Constitutional:   No  weight loss, night sweats,  Fevers, chills, fatigue, or  lassitude.  HEENT:   No headaches,  Difficulty swallowing,  Tooth/dental problems, or  Sore throat,                No sneezing, itching, ear ache, nasal congestion, post nasal drip,   CV:  No chest pain,  Orthopnea, PND, swelling in lower extremities, anasarca, dizziness, palpitations, syncope.   GI  No heartburn, indigestion, abdominal pain, nausea, vomiting, diarrhea, change in bowel habits, loss of appetite,  bloody stools.   Resp: No shortness of breath with exertion or at rest.  No excess mucus, no productive cough,  Occasional  non-productive cough,  No coughing up of blood.  + change in color of mucus.  No wheezing.  No chest wall deformity  Skin: no rash or lesions.  GU: no dysuria, change in color of urine, no urgency or frequency.  No flank pain, no hematuria   MS:  + joint pain no swelling.  No decreased range of motion.  No back pain.  Psych:  No change in mood or affect. No depression or anxiety.  No memory loss.   Vital Signs BP 138/86   Pulse 80   Ht 5' (1.524 m)   Wt 208 lb 6.4 oz (  94.5 kg)   SpO2 96%   BMI 40.70 kg/m    Physical Exam:  General- No distress,  A&Ox3, pleasant ENT: No sinus tenderness, TM clear, pale nasal mucosa, no oral exudate,+ post nasal drip, no LAN Cardiac: S1, S2, regular rate and rhythm, no murmur Chest: No wheeze/ rales/ dullness; no accessory muscle use, no nasal flaring, no sternal retractions, diminished per bases Abd.: Soft Non-tender, ND, BS +, Obese Ext: No clubbing cyanosis, edema Neuro:  normal strength Skin: No rashes, warm and dry Psych: normal mood and behavior   Assessment/Plan  Chronic cough Improved with addition of Zyrtec and Prilosec Plan: We will make sure you have refills for your Prilosec.( Send in a prescription) Continue Symbicort 2 puffs twice daily Rinse mouth after use. Continue  omeprazole 20 mg at bedtime x 2 additional months. GERD Diet Sips of water instead of throat clearing Sugar Free Jolly Ranchers or Werther's originals for throat soothing. Delsym Cough syrup 5 cc's every 12 hours Continue Non-sedating antihistamine of your choice daily ( Zyrtec, Allegra, Xyzol, Claritin ( Generic ok) Will order a  CT Chest without contrast for cough. We will schedule  PFT's  Follow up with Dr. Chase Caller or Judson Roch NP in 8 weeks Please contact office for sooner follow up if symptoms do not improve or worsen or seek  emergency care       Magdalen Spatz, NP 09/25/2017  2:21 PM

## 2017-09-25 NOTE — Assessment & Plan Note (Signed)
Improved with addition of Zyrtec and Prilosec Plan: We will make sure you have refills for your Prilosec.( Send in a prescription) Continue Symbicort 2 puffs twice daily Rinse mouth after use. Continue  omeprazole 20 mg at bedtime x 2 additional months. GERD Diet Sips of water instead of throat clearing Sugar Free Jolly Ranchers or Werther's originals for throat soothing. Delsym Cough syrup 5 cc's every 12 hours Continue Non-sedating antihistamine of your choice daily ( Zyrtec, Allegra, Xyzol, Claritin ( Generic ok) Will order a  CT Chest without contrast for cough. We will schedule  PFT's  Follow up with Dr. Chase Caller or Judson Roch NP in 8 weeks Please contact office for sooner follow up if symptoms do not improve or worsen or seek emergency care

## 2017-09-27 ENCOUNTER — Telehealth: Payer: Self-pay | Admitting: Physician Assistant

## 2017-09-27 ENCOUNTER — Encounter: Payer: Self-pay | Admitting: Pediatrics

## 2017-09-27 ENCOUNTER — Ambulatory Visit (INDEPENDENT_AMBULATORY_CARE_PROVIDER_SITE_OTHER): Payer: Medicare Other | Admitting: Pediatrics

## 2017-09-27 VITALS — BP 126/71 | HR 70 | Temp 97.2°F | Ht 60.0 in | Wt 203.6 lb

## 2017-09-27 DIAGNOSIS — R4589 Other symptoms and signs involving emotional state: Secondary | ICD-10-CM

## 2017-09-27 DIAGNOSIS — F329 Major depressive disorder, single episode, unspecified: Secondary | ICD-10-CM

## 2017-09-27 MED ORDER — MIRTAZAPINE 7.5 MG PO TABS: 7.5000 mg | ORAL_TABLET | Freq: Every day | ORAL | 3 refills | Status: DC

## 2017-09-27 NOTE — Progress Notes (Signed)
  Subjective:   Patient ID: [name], female    DOB: 1940/06/27, 77 y.o.   MRN: [id number] CC: Anxiety  HPI: [name] is a 77 y.o. female   Lots of stress at home.  Multiple deaths in the family over the last few months.  Sister-in-law most recently died a week and half ago.  Lots of stress trying to settle her affairs, take care of her husband with dementia who is currently admitted.  Her mood is been down.  She is feeling more depressed.  She saw her shoulder doctor, was told that she needs to start doing more things to relieve her stress.  She was started on Flexeril for neck and shoulder pain, it has been helping some.  She has a hard time sleeping at night.  Abdominal pain has improved.  She still has some loose stools off and on.  Relevant past medical, surgical, family and social history reviewed. Allergies and medications reviewed and updated. Social History   Tobacco Use  Smoking Status Never Smoker  Smokeless Tobacco Never Used   ROS: Per HPI   Objective:    BP 126/71   Pulse 70   Temp (!) 97.2 F (36.2 C) (Oral)   Ht 5' (1.524 m)   Wt 203 lb 9.6 oz (92.4 kg)   BMI 39.76 kg/m   Wt Readings from Last 3 Encounters:  09/27/17 203 lb 9.6 oz (92.4 kg)  09/25/17 208 lb 6.4 oz (94.5 kg)  09/16/17 201 lb 6.4 oz (91.4 kg)    Gen: NAD, alert, cooperative with exam, NCAT EYES: EOMI, no conjunctival injection, or no icterus CV: NRRR, normal S1/S2, no murmur Resp: CTABL, no wheezes, normal WOB Abd: +BS, soft, NTND. no guarding or organomegaly Ext: Non-pitting edema present, warm Neuro: Alert and oriented, strength equal b/l UE and LE, coordination grossly normal Psych: Normal affect.  Assessment & Plan:  [name] was seen today for anxiety.  Diagnoses and all orders for this visit:  Depressed mood Ongoing symptoms.  Also with increased stress.  Multiple deaths recently.  Mood is been down.  Discussed stress relieving techniques.  Encourage good self-care.   Will start below.  Having a hard time sleeping. -     mirtazapine (REMERON) 7.5 MG tablet; Take 1 tablet (7.5 mg total) by mouth at bedtime.   Follow up plan: Return in about 1 month (around 10/25/2017). Assunta Found, MD [place]

## 2017-09-30 ENCOUNTER — Other Ambulatory Visit: Payer: Self-pay | Admitting: Physician Assistant

## 2017-09-30 DIAGNOSIS — M1711 Unilateral primary osteoarthritis, right knee: Secondary | ICD-10-CM

## 2017-10-04 ENCOUNTER — Ambulatory Visit (INDEPENDENT_AMBULATORY_CARE_PROVIDER_SITE_OTHER)
Admission: RE | Admit: 2017-10-04 | Discharge: 2017-10-04 | Disposition: A | Discharge status: Home / Self Care | Payer: Medicare Other | Source: Ambulatory Visit | Attending: Acute Care | Admitting: Acute Care

## 2017-10-04 DIAGNOSIS — R059 Cough, unspecified: Secondary | ICD-10-CM

## 2017-10-04 DIAGNOSIS — R918 Other nonspecific abnormal finding of lung field: Secondary | ICD-10-CM | POA: Diagnosis not present

## 2017-10-04 DIAGNOSIS — R05 Cough: Secondary | ICD-10-CM

## 2017-10-16 ENCOUNTER — Other Ambulatory Visit: Payer: Self-pay

## 2017-10-16 NOTE — Patient Outreach (Signed)
[facility]) Care Management  10/16/2017  [name] 1940-06-20 [id number]   Medication Adherence call to [name] spoke with patient she is no longer taking Lisinopril 5 mg doctor took her off this medication. [name] is showing past due under [facility].  [facility] Direct Dial [phone]  Fax [phone] [name]

## 2017-10-26 ENCOUNTER — Other Ambulatory Visit: Payer: Self-pay | Admitting: Physician Assistant

## 2017-10-28 ENCOUNTER — Other Ambulatory Visit: Payer: Self-pay | Admitting: Physician Assistant

## 2017-10-28 NOTE — Telephone Encounter (Signed)
Last seen 09/27/17  Glenard Haring

## 2017-10-31 ENCOUNTER — Ambulatory Visit (INDEPENDENT_AMBULATORY_CARE_PROVIDER_SITE_OTHER): Payer: Medicare Other | Admitting: Physician Assistant

## 2017-10-31 ENCOUNTER — Other Ambulatory Visit: Payer: Self-pay | Admitting: Physician Assistant

## 2017-10-31 ENCOUNTER — Telehealth: Payer: Self-pay | Admitting: Physician Assistant

## 2017-10-31 ENCOUNTER — Encounter: Payer: Self-pay | Admitting: Physician Assistant

## 2017-10-31 VITALS — BP 145/80 | HR 66 | Temp 96.8°F | Ht 59.5 in | Wt 206.2 lb

## 2017-10-31 DIAGNOSIS — F339 Major depressive disorder, recurrent, unspecified: Secondary | ICD-10-CM | POA: Diagnosis not present

## 2017-10-31 DIAGNOSIS — G8929 Other chronic pain: Secondary | ICD-10-CM

## 2017-10-31 DIAGNOSIS — I10 Essential (primary) hypertension: Secondary | ICD-10-CM

## 2017-10-31 DIAGNOSIS — Z23 Encounter for immunization: Secondary | ICD-10-CM

## 2017-10-31 DIAGNOSIS — M25572 Pain in left ankle and joints of left foot: Secondary | ICD-10-CM

## 2017-10-31 MED ORDER — SULFAMETHOXAZOLE-TRIMETHOPRIM 800-160 MG PO TABS: 1.0000 | ORAL_TABLET | Freq: Two times a day (BID) | ORAL | 0 refills | Status: DC

## 2017-10-31 MED ORDER — CITALOPRAM HYDROBROMIDE 40 MG PO TABS: 40.0000 mg | ORAL_TABLET | Freq: Every day | ORAL | 5 refills | Status: DC

## 2017-10-31 MED ORDER — DONEPEZIL HCL 10 MG PO TABS: 10.0000 mg | ORAL_TABLET | Freq: Every day | ORAL | 11 refills | Status: DC

## 2017-10-31 NOTE — Progress Notes (Signed)
javascript:; 

## 2017-10-31 NOTE — Telephone Encounter (Signed)
Sent med to pharmacy  

## 2017-11-01 NOTE — Telephone Encounter (Signed)
Patient aware.

## 2017-11-04 NOTE — Progress Notes (Signed)
.   BP (!) 145/80   Pulse 66   Temp (!) 96.8 F (36 C) (Oral)   Ht 4' 11.5" (1.511 m)   Wt 206 lb 3.2 oz (93.5 kg)   BMI 40.95 kg/m     Subjective:    Patient ID: [name], female    DOB: 04-02-1940, 77 y.o.   MRN: [id number]  HPI: [name] is a 77 y.o. female presenting on 10/31/2017 for Depression (1 month follow up ) SheThis patient comes in for one-month follow-up on her depression.  She is still dealing with legal matters since the death of her sister-in-law and her husband.  Her husband is quite ill with Parkinson's and is having a lot of difficulty getting around. a lot of stressors related to family members and deaths.  She states that her left foot and ankle are still bothering her tremendously.  She would like a referral to another foot doctor.  We have discussed Dr. Doran Durand in [place].   Past Medical History:  Diagnosis Date  . Anxiety   . Degeneration of cervical intervertebral disc   . Generalized osteoarthrosis, involving multiple sites   . Radiculopathy, cervical   . Rosacea keratitis   . Varicose veins    Relevant past medical, surgical, family and social history reviewed and updated as indicated. Interim medical history since our last visit reviewed. Allergies and medications reviewed and updated. DATA REVIEWED: CHART IN EPIC  Family History reviewed for pertinent findings.  Review of Systems  Constitutional: Negative.   HENT: Negative.   Eyes: Negative.   Respiratory: Negative.   Gastrointestinal: Negative.   Genitourinary: Negative.   Musculoskeletal: Positive for arthralgias, gait problem and joint swelling.  Psychiatric/Behavioral: Positive for dysphoric mood. Negative for sleep disturbance and suicidal ideas. The patient is nervous/anxious.     Allergies as of 10/31/2017      Reactions   Acetaminophen Other (See Comments)   Tylenol with codeine only - GI UPSET   Amoxicillin Rash   Ampicillin Rash   Codeine Phosphate Nausea Only        Medication List        Accurate as of 10/31/17 11:59 PM. Always use your most recent med list.          albuterol 108 (90 Base) MCG/ACT inhaler Commonly known as:  PROVENTIL HFA;VENTOLIN HFA Inhale 2 puffs into the lungs every 6 (six) hours as needed for wheezing or shortness of breath.   aspirin EC 81 MG tablet Take 81 mg by mouth daily.   budesonide-formoterol 160-4.5 MCG/ACT inhaler Commonly known as:  SYMBICORT Inhale 2 puffs into the lungs 2 (two) times daily.   citalopram 40 MG tablet Commonly known as:  CELEXA Take 1 tablet (40 mg total) by mouth daily.   donepezil 10 MG tablet Commonly known as:  ARICEPT Take 1 tablet (10 mg total) by mouth at bedtime.   furosemide 20 MG tablet Commonly known as:  LASIX Take 20 mg by mouth as needed.   ibuprofen 800 MG tablet Commonly known as:  ADVIL,MOTRIN TAKE  (1)  TABLET  THREE TIMES DAILY.   mirtazapine 7.5 MG tablet Commonly known as:  REMERON Take 1 tablet (7.5 mg total) by mouth at bedtime.   MYRBETRIQ 50 MG Tb24 tablet Generic drug:  mirabegron ER Take 50 mg by mouth daily.   omeprazole 20 MG capsule Commonly known as:  PRILOSEC Take 1 capsule (20 mg total) by mouth daily.   phentermine 37.5 MG tablet Commonly known  as:  ADIPEX-P TAKE 1 TABLET EVERY DAY   pravastatin 20 MG tablet Commonly known as:  PRAVACHOL Take 20 mg by mouth daily.   sulfamethoxazole-trimethoprim 800-160 MG tablet Commonly known as:  BACTRIM DS,SEPTRA DS Take 1 tablet by mouth 2 (two) times daily.   traZODone 100 MG tablet Commonly known as:  DESYREL Take 2-3 tablets (200-300 mg total) by mouth at bedtime.          Objective:    BP (!) 145/80   Pulse 66   Temp (!) 96.8 F (36 C) (Oral)   Ht 4' 11.5" (1.511 m)   Wt 206 lb 3.2 oz (93.5 kg)   BMI 40.95 kg/m    Allergies  Allergen Reactions  . Acetaminophen Other (See Comments)    Tylenol with codeine only - GI UPSET  . Amoxicillin Rash  . Ampicillin Rash  .  Codeine Phosphate Nausea Only    Wt Readings from Last 3 Encounters:  10/31/17 206 lb 3.2 oz (93.5 kg)  09/27/17 203 lb 9.6 oz (92.4 kg)  09/25/17 208 lb 6.4 oz (94.5 kg)    Physical Exam  Constitutional: She is oriented to person, place, and time. She appears well-developed and well-nourished.  HENT:  Head: Normocephalic and atraumatic.  Eyes: Pupils are equal, round, and reactive to light. Conjunctivae and EOM are normal.  Cardiovascular: Normal rate, regular rhythm, normal heart sounds and intact distal pulses.  Pulmonary/Chest: Effort normal and breath sounds normal.  Abdominal: Soft. Bowel sounds are normal.  Musculoskeletal:       Left ankle: She exhibits decreased range of motion, swelling and deformity. Tenderness. Lateral malleolus tenderness found.       Feet:  Neurological: She is alert and oriented to person, place, and time. She has normal reflexes.  Skin: Skin is warm and dry. No rash noted.  Psychiatric: She has a normal mood and affect. Her behavior is normal. Judgment and thought content normal.        Assessment & Plan:   1. Depression, recurrent ([place]) - citalopram (CELEXA) 40 MG tablet; Take 1 tablet (40 mg total) by mouth daily.  Dispense: 30 tablet; Refill: 5  2. Essential hypertension Continue medications  3. Chronic pain of left ankle Referral to Dr. Doran Durand  4. Need for shingles vaccine - Varicella-zoster vaccine IM (Shingrix)   Continue all other maintenance medications as listed above.  Follow up plan: Return in about 3 months (around 01/31/2018) for recheck.  Educational handout given for [place] PA-C [place] [address]  [place], [place] [id number] [phone]   11/04/2017, 9:57 PM

## 2017-11-18 DIAGNOSIS — M79672 Pain in left foot: Secondary | ICD-10-CM | POA: Diagnosis not present

## 2017-11-18 DIAGNOSIS — I890 Lymphedema, not elsewhere classified: Secondary | ICD-10-CM | POA: Diagnosis not present

## 2017-11-20 ENCOUNTER — Encounter: Payer: Self-pay | Admitting: Acute Care

## 2017-11-20 ENCOUNTER — Ambulatory Visit: Payer: Medicare Other | Admitting: Acute Care

## 2017-11-20 ENCOUNTER — Ambulatory Visit (INDEPENDENT_AMBULATORY_CARE_PROVIDER_SITE_OTHER): Payer: Medicare Other | Admitting: Internal Medicine

## 2017-11-20 DIAGNOSIS — R05 Cough: Secondary | ICD-10-CM

## 2017-11-20 DIAGNOSIS — K219 Gastro-esophageal reflux disease without esophagitis: Secondary | ICD-10-CM | POA: Diagnosis not present

## 2017-11-20 DIAGNOSIS — R059 Cough, unspecified: Secondary | ICD-10-CM

## 2017-11-20 DIAGNOSIS — R053 Chronic cough: Secondary | ICD-10-CM

## 2017-11-20 LAB — PULMONARY FUNCTION TEST
DL/VA % pred: 95 %
DL/VA: 3.99 ml/min/mmHg/L
DLCO unc % pred: 85 %
DLCO unc: 15.77 ml/min/mmHg
FEF 25-75 Post: 3.66 L/sec
FEF 25-75 Pre: 3.22 L/sec
FEF2575-%CHANGE-POST: 13 %
FEF2575-%Pred-Post: 307 %
FEF2575-%Pred-Pre: 270 %
FEV1-%CHANGE-POST: 5 %
FEV1-%PRED-PRE: 123 %
FEV1-%Pred-Post: 129 %
FEV1-POST: 2.02 L
FEV1-Pre: 1.92 L
FEV1FVC-%CHANGE-POST: 6 %
FEV1FVC-%Pred-Pre: 116 %
FEV6-%Change-Post: 0 %
FEV6-%PRED-POST: 110 %
FEV6-%Pred-Pre: 110 %
FEV6-PRE: 2.21 L
FEV6-Post: 2.21 L
FEV6FVC-%Change-Post: 1 %
FEV6FVC-%PRED-PRE: 105 %
FEV6FVC-%Pred-Post: 106 %
FVC-%CHANGE-POST: -1 %
FVC-%PRED-POST: 104 %
FVC-%PRED-PRE: 105 %
FVC-POST: 2.21 L
FVC-Pre: 2.23 L
POST FEV1/FVC RATIO: 92 %
PRE FEV6/FVC RATIO: 99 %
Post FEV6/FVC ratio: 100 %
Pre FEV1/FVC ratio: 86 %
RV % PRED: 75 %
RV: 1.63 L
TLC % pred: 96 %
TLC: 4.25 L

## 2017-11-20 NOTE — Assessment & Plan Note (Signed)
Contributing to upper airway cough Plan: Continue your Prilosec daily Add Pepcid 10 mg at bedtime ( generic ok) famatodine Follow up in 3 months with Dr. Chase Caller or Judson Roch NP.

## 2017-11-20 NOTE — Patient Instructions (Addendum)
It is good to see you today. Your CT was essentially normal. PFT's are normal There were 2 small benign appearing nodules that we will check in 1 year with a follow up CT. We will continue Symbicort 2 puffs twice daily. Continue your Prilosec daily Add Pepcid 10 mg at bedtime ( generic ok) famatodine Add Zyrtec or Claritin 10 mg daily ( generics are ok) Add Flonase 2 squirts each nare once daily ( generic ok Fluticasone) Follow up in 3 months with Dr. Chase Caller or Judson Roch NP. Please contact office for sooner follow up if symptoms do not improve or worsen or seek emergency care

## 2017-11-20 NOTE — Progress Notes (Signed)
History of Present Illness [name] is a 77 y.o. female never smoker with chronic cough since starting low dose Lisinopril one year ago. She also has a history of bronchitis. She was seen x 1 for consult by Dr. Chase Hoffman on 08/08/2017 for cough..    11/20/2017  Pt. Presents for follow up. She was last seen 09/25/2017. Marland KitchenShe states today she is still coughing but not badly. She feels her cough is 90% better since starting the treatment here in the office.She states she does have a bit of a wheeze on occasion. She states her cough is worse at night. She states she is not using her Symbicort as a maintenance therapy  She does use it on occasion prn. She is not using the non-sedating antihistamine as instructed.She states she is compliant with her Prilosec daily.She continues to have minimal cough. She is throat clearing in the office today.She is not using Flonase . No fever, chest pain, orthopnea or hemoptysis.She has lymphedema bilaterally. She is receiving treatment through her PCP for this. She states this has been a chronic problem.  Test Results: CT Chest 10/04/2017 No evidence of acute cardiopulmonary disease. Small bilateral pulmonary nodules measuring up to 4 mm. No follow-up needed if patient is low-risk. Non-contrast chest CT can be considered in 12 months if patient is high-risk.  PFT's: 11/20/2017 FVC 2.23 / 105% predicted FEV1 1.92/ 123% predicted F/F ratio 86%/ 116 % predicted TLC 96% predicted DLCO 85% predicted   BMP Latest Ref Rng & Units 09/16/2017 09/11/2016 03/16/2016  Glucose 65 - 99 mg/dL 119(H) 92 91  BUN 8 - 27 mg/dL 11 18 16   Creatinine 0.57 - 1.00 mg/dL 0.59 0.68 0.81  BUN/Creat Ratio 12 - 28 19 26 20   Sodium 134 - 144 mmol/L 138 143 140  Potassium 3.5 - 5.2 mmol/L 3.8 4.4 4.4  Chloride 96 - 106 mmol/L 99 104 101  CO2 20 - 29 mmol/L 25 24 23   Calcium 8.7 - 10.3 mg/dL 9.1 9.3 9.3    BNP No results found for: BNP  ProBNP No results found for:  PROBNP  PFT    Component Value Date/Time   FEV1PRE 1.92 11/20/2017 1255   FEV1POST 2.02 11/20/2017 1255   FVCPRE 2.23 11/20/2017 1255   FVCPOST 2.21 11/20/2017 1255   TLC 4.25 11/20/2017 1255   DLCOUNC 15.77 11/20/2017 1255   PREFEV1FVCRT 86 11/20/2017 1255   PSTFEV1FVCRT 92 11/20/2017 1255    No results found.   Past medical hx Past Medical History:  Diagnosis Date  . Anxiety   . Degeneration of cervical intervertebral disc   . Generalized osteoarthrosis, involving multiple sites   . Radiculopathy, cervical   . Rosacea keratitis   . Varicose veins      Social History   Tobacco Use  . Smoking status: Never Smoker  . Smokeless tobacco: Never Used  Substance Use Topics  . Alcohol use: Yes    Comment: occ  . Drug use: No    Ms.[name] reports that she has never smoked. She has never used smokeless tobacco. She reports that she drinks alcohol. She reports that she does not use drugs.  Tobacco Cessation: Never smoker  Past surgical hx, Family hx, Social hx all reviewed.  Current Outpatient Medications on File Prior to Visit  Medication Sig  . albuterol (PROVENTIL HFA;VENTOLIN HFA) 108 (90 Base) MCG/ACT inhaler Inhale 2 puffs into the lungs every 6 (six) hours as needed for wheezing or shortness of breath.  Marland Kitchen aspirin EC  81 MG tablet Take 81 mg by mouth daily.  . budesonide-formoterol (SYMBICORT) 160-4.5 MCG/ACT inhaler Inhale 2 puffs into the lungs 2 (two) times daily.  . citalopram (CELEXA) 40 MG tablet Take 1 tablet (40 mg total) by mouth daily.  Marland Kitchen donepezil (ARICEPT) 10 MG tablet Take 1 tablet (10 mg total) by mouth at bedtime.  . furosemide (LASIX) 20 MG tablet Take 20 mg by mouth as needed.  Marland Kitchen ibuprofen (ADVIL,MOTRIN) 800 MG tablet TAKE  (1)  TABLET  THREE TIMES DAILY.  Marland Kitchen omeprazole (PRILOSEC) 20 MG capsule Take 1 capsule (20 mg total) by mouth daily.  . phentermine (ADIPEX-P) 37.5 MG tablet TAKE 1 TABLET EVERY DAY  . pravastatin (PRAVACHOL) 20 MG tablet Take  20 mg by mouth daily.  . traZODone (DESYREL) 100 MG tablet Take 2-3 tablets (200-300 mg total) by mouth at bedtime.  Marland Kitchen HYDROcodone-acetaminophen (NORCO/VICODIN) 5-325 MG tablet hydrocodone 5 mg-acetaminophen 325 mg tablet   No current facility-administered medications on file prior to visit.      Allergies  Allergen Reactions  . Acetaminophen Other (See Comments)    Tylenol with codeine only - GI UPSET  . Amoxicillin Rash  . Ampicillin Rash  . Codeine Phosphate Nausea Only    Review Of Systems:  Constitutional:   No  weight loss, night sweats,  Fevers, chills, fatigue, or  lassitude.  HEENT:   No headaches,  Difficulty swallowing,  Tooth/dental problems, or  Sore throat,                No sneezing, itching, ear ache, nasal congestion, +post nasal drip,   CV:  No chest pain,  Orthopnea, PND, + swelling in lower extremities bilaterally, No anasarca, dizziness, palpitations, syncope.   GI  No heartburn, indigestion, abdominal pain, nausea, vomiting, diarrhea, change in bowel habits, loss of appetite, bloody stools.   Resp:+  Occasional  shortness of breath with exertion or at rest.   no  excess mucus , no productive cough,  + non-productive cough,  No coughing up of blood.  + Yellow  change in color of mucus.  + rare  wheezing.  No chest wall deformity  Skin: no rash or lesions.  Extremities:Bilateral lymphedema, otherwise no abnormalities  GU: no dysuria, change in color of urine, no urgency or frequency.  No flank pain, no hematuria   MS:  No joint pain or swelling.  No decreased range of motion.  No back pain.  Psych:  No change in mood or affect. No depression or anxiety. + occasional  memory loss.   Vital Signs BP (!) 130/92 (BP Location: Left Arm, Cuff Size: Normal)   Pulse 85   Ht 4' 11.75" (1.518 m)   Wt 206 lb (93.4 kg)   SpO2 95%   BMI 40.57 kg/m    Physical Exam:  General- No distress,  A&Ox3, pleasant ENT: No sinus tenderness, TM clear, pale nasal mucosa,  no oral exudate,+ post nasal drip, no LAN Cardiac: S1, S2, regular rate and rhythm, no murmur Chest: No wheeze/ rales/ dullness; no accessory muscle use, no nasal flaring, no sternal retractions, diminished per bases Abd.: Soft Non-tender, ND, BS +, Body mass index is 40.57 kg/m. Ext: No clubbing cyanosis,+ bilateral lymphedema  Neuro:  normal strength, MAE x 4, A&O x e, Appropriate Skin: No rashes, warm and dry Psych: normal mood and behavior   Assessment/Plan  Chronic cough Per patient is 90% better since she started treatment here.  States she still has occasional cough  with yellow sputum Not using her Symbicort Not using non-sedating anti histamine NOt using Flonase Compliant with prilosec Normal CT Chest>> 2 small nodules benign appearing>> consider annual follow up CT. Normal PFT's Plan: Your CT was essentially normal. PFT's are normal There were 2 small benign appearing nodules that we will check in 1 year with a follow up CT. We will continue Symbicort 2 puffs twice daily. Rinse mouth after use Continue your Prilosec daily Add Pepcid 10 mg at bedtime ( generic ok) famatodine Add Zyrtec or Claritin 10 mg daily ( generics are ok) Add Flonase 2 squirts each nare once daily ( generic ok Fluticasone) Follow up in 3 months with Dr. Chase Hoffman or Judson Roch NP.   GERD (gastroesophageal reflux disease) Contributing to upper airway cough Plan: Continue your Prilosec daily Add Pepcid 10 mg at bedtime ( generic ok) famatodine Follow up in 3 months with Dr. Chase Hoffman or Judson Roch NP.     Magdalen Spatz, NP 11/20/2017  3:18 PM

## 2017-11-20 NOTE — Assessment & Plan Note (Signed)
Per patient is 90% better since she started treatment here.  States she still has occasional cough with yellow sputum Not using her Symbicort Not using non-sedating anti histamine NOt using Flonase Compliant with prilosec Normal CT Chest>> 2 small nodules benign appearing>> consider annual follow up CT. Normal PFT's Plan: Your CT was essentially normal. PFT's are normal There were 2 small benign appearing nodules that we will check in 1 year with a follow up CT. We will continue Symbicort 2 puffs twice daily. Rinse mouth after use Continue your Prilosec daily Add Pepcid 10 mg at bedtime ( generic ok) famatodine Add Zyrtec or Claritin 10 mg daily ( generics are ok) Add Flonase 2 squirts each nare once daily ( generic ok Fluticasone) Follow up in 3 months with Dr. Chase Caller or Judson Roch NP.

## 2017-11-20 NOTE — Progress Notes (Signed)
PFT done today. 

## 2017-11-27 DIAGNOSIS — I890 Lymphedema, not elsewhere classified: Secondary | ICD-10-CM | POA: Diagnosis not present

## 2017-12-04 DIAGNOSIS — M76892 Other specified enthesopathies of left lower limb, excluding foot: Secondary | ICD-10-CM | POA: Diagnosis not present

## 2017-12-05 ENCOUNTER — Other Ambulatory Visit: Payer: Self-pay | Admitting: Physician Assistant

## 2017-12-17 ENCOUNTER — Telehealth: Payer: Self-pay

## 2017-12-17 ENCOUNTER — Other Ambulatory Visit: Payer: Self-pay | Admitting: Physician Assistant

## 2017-12-17 DIAGNOSIS — Z1239 Encounter for other screening for malignant neoplasm of breast: Secondary | ICD-10-CM

## 2017-12-17 NOTE — Telephone Encounter (Signed)
Need an order for a mammogram

## 2017-12-17 NOTE — Telephone Encounter (Signed)
Order placed

## 2017-12-19 ENCOUNTER — Other Ambulatory Visit: Payer: Self-pay | Admitting: Acute Care

## 2017-12-25 DIAGNOSIS — Z1231 Encounter for screening mammogram for malignant neoplasm of breast: Secondary | ICD-10-CM | POA: Diagnosis not present

## 2018-01-07 DIAGNOSIS — R350 Frequency of micturition: Secondary | ICD-10-CM | POA: Diagnosis not present

## 2018-01-09 DIAGNOSIS — R928 Other abnormal and inconclusive findings on diagnostic imaging of breast: Secondary | ICD-10-CM | POA: Diagnosis not present

## 2018-01-09 DIAGNOSIS — N6321 Unspecified lump in the left breast, upper outer quadrant: Secondary | ICD-10-CM | POA: Diagnosis not present

## 2018-01-09 DIAGNOSIS — R922 Inconclusive mammogram: Secondary | ICD-10-CM | POA: Diagnosis not present

## 2018-01-09 DIAGNOSIS — R921 Mammographic calcification found on diagnostic imaging of breast: Secondary | ICD-10-CM | POA: Diagnosis not present

## 2018-01-09 DIAGNOSIS — R590 Localized enlarged lymph nodes: Secondary | ICD-10-CM | POA: Diagnosis not present

## 2018-01-12 ENCOUNTER — Other Ambulatory Visit: Payer: Self-pay | Admitting: Physician Assistant

## 2018-01-12 MED ORDER — ALPRAZOLAM 0.5 MG PO TABS: 0.5000 mg | ORAL_TABLET | Freq: Two times a day (BID) | ORAL | 1 refills | Status: DC | PRN

## 2018-01-23 ENCOUNTER — Telehealth: Payer: Self-pay

## 2018-01-23 NOTE — Telephone Encounter (Signed)
Faxed originally 01/13/18 and have receipt verification Faxed again 01/23/18

## 2018-01-23 NOTE — Telephone Encounter (Signed)
Needs an order for Left breast BX and left breast fine needle aspiration   Fax to [phone]

## 2018-01-24 ENCOUNTER — Other Ambulatory Visit: Payer: Self-pay | Admitting: Physician Assistant

## 2018-01-24 NOTE — Telephone Encounter (Signed)
Last seen 10/31/17

## 2018-01-27 DIAGNOSIS — C50412 Malignant neoplasm of upper-outer quadrant of left female breast: Secondary | ICD-10-CM | POA: Diagnosis not present

## 2018-01-27 DIAGNOSIS — R599 Enlarged lymph nodes, unspecified: Secondary | ICD-10-CM | POA: Diagnosis not present

## 2018-01-27 DIAGNOSIS — R590 Localized enlarged lymph nodes: Secondary | ICD-10-CM | POA: Diagnosis not present

## 2018-01-27 DIAGNOSIS — N6321 Unspecified lump in the left breast, upper outer quadrant: Secondary | ICD-10-CM | POA: Diagnosis not present

## 2018-01-27 DIAGNOSIS — R229 Localized swelling, mass and lump, unspecified: Secondary | ICD-10-CM | POA: Diagnosis not present

## 2018-01-27 DIAGNOSIS — D0512 Intraductal carcinoma in situ of left breast: Secondary | ICD-10-CM | POA: Diagnosis not present

## 2018-01-27 DIAGNOSIS — Z171 Estrogen receptor negative status [ER-]: Secondary | ICD-10-CM | POA: Diagnosis not present

## 2018-02-03 ENCOUNTER — Ambulatory Visit: Payer: Medicare Other | Admitting: Acute Care

## 2018-02-03 ENCOUNTER — Encounter: Payer: Self-pay | Admitting: Acute Care

## 2018-02-03 VITALS — BP 120/70 | HR 66 | Ht 59.75 in | Wt 205.6 lb

## 2018-02-03 DIAGNOSIS — C50919 Malignant neoplasm of unspecified site of unspecified female breast: Secondary | ICD-10-CM | POA: Diagnosis not present

## 2018-02-03 DIAGNOSIS — R918 Other nonspecific abnormal finding of lung field: Secondary | ICD-10-CM

## 2018-02-03 DIAGNOSIS — R053 Chronic cough: Secondary | ICD-10-CM

## 2018-02-03 DIAGNOSIS — R05 Cough: Secondary | ICD-10-CM

## 2018-02-03 NOTE — Assessment & Plan Note (Signed)
Follow up CT chest without contrast 10/2018

## 2018-02-03 NOTE — Assessment & Plan Note (Signed)
Diagnosed 01/2018>> Biopsy Plan Follow up with oncology and surgery as scheduled

## 2018-02-03 NOTE — Assessment & Plan Note (Signed)
Upper Airway cough at baseline Suspect Allergic and GERD etiology Not using Pepcid at bedtime Plan: I am so sorry to hear about your breast cancer diagnosis.. We will continue the Prilosec daily as you have been doing.  Remember to add pepcid 20 mg at bedtime. Follow GERD diet as able , no caffeine at night. Avoid mint, menthol, spearmint Sips of water instead of throat clearing Sugar Free [employer] or [employer] originals for throat soothing. Delsym Cough syrup 5 cc's every 12 hours Non-sedating antihistamine of your choice daily ( Zyrtec, Allegra, Xyzol, Claritin ( Generic ok) Slightly elevate the head of your bed with blocks or wedge beneath the mattress. We will send you to an allergist when you have completed therapy for your new breast cancer diagnosis. We will schedule a follow up CT chest in August of 2020( 12 months after previous scan) Follow up with Judson Roch NP, or Dr. Chase Caller in 6 months Please contact office for sooner follow up if symptoms do not improve or worsen or seek emergency care

## 2018-02-03 NOTE — Patient Instructions (Addendum)
It is good to see you today I am so sorry to hear about your breast cancer diagnosis.. We will continue the Prilosec daily as you have been doing.  Remember to add pepcid 20 mg at bedtime. Follow GERD diet as able , no caffeine at night. Avoid mint, menthol, spearmint Sips of water instead of throat clearing Sugar Free [employer] or [employer] originals for throat soothing. Delsym Cough syrup 5 cc's every 12 hours Non-sedating antihistamine of your choice daily ( Zyrtec, Allegra, Xyzol, Claritin ( Generic ok) Slightly elevate the head of your bed with blocks or wedge beneath the mattress. We will send you to an allergist when you have completed therapy for your new breast cancer diagnosis. We will schedule a follow up CT chest in August of 2020( 12 months after previous scan) Follow up with Judson Roch NP, or Dr. Chase Caller in 6 months Please contact office for sooner follow up if symptoms do not improve or worsen or seek emergency care

## 2018-02-03 NOTE — Progress Notes (Signed)
History of Present Illness [name] is a 77 y.o. female  never smoker with chronic cough since starting low dose Lisinopril in 2018. She also has a history of bronchitis. She was seen x 1 for consult by Dr. Chase Caller on 08/08/2017 for cough.She has recently been diagnosed ( 01/2018) with invasive ductal breast cancer. .     02/03/2018 3 month follow up for cough Pt. Presents for follow up. She was last seen 11/20/2017 . At that time she was not taking her Symbicort daily as prescribed, but " as needed". We spent a long time discussing the difference between maintenance medications and rescue medications. She was not using her non-sedating antihistamine, or her Flonase. She was compliant with her Prilosec. Plan after that visit was: Use Symbicort 2 puffs  every day without fail, rinse mouth after use. Add non-sedating anti-histamine, Add Flonase, continue prilosec, add pepcid 10 mg at bedtime. Please work on compliance with these medications. She presents for follow up. She states her cough is ok. She still has upper airway cough when she lies down. She has been compliant with her Flonase, Zyrtec, Prilosec. She did not add pepcid at bedtime. She has a new diagnosis of breast cancer. She has follow up appointments with oncology and surgery. Her cough is not her priority at present. It is not bothering her. We discussed allergy work up when she has completed her breast cancer treatment.We also discussed ENT referral. Again once she gets over the cancer diagnosis. I have encouraged her to return to see [place] with any flare or worsening. We discussed she will need follow up CT chest 10/2017. She denies fever, chest pain, orthopnea or hemoptysis.      Test Results: CT Chest 10/04/2017 No evidence of acute cardiopulmonary disease. Small bilateral pulmonary nodules measuring up to 4 mm. No follow-up needed if patient is low-risk. Non-contrast chest CT can be considered in 12 months if patient is  high-risk.  PFT's: 11/20/2017 FVC 2.23 / 105% predicted FEV1 1.92/ 123% predicted F/F ratio 86%/ 116 % predicted TLC 96% predicted DLCO 85% predicted   CBC Latest Ref Rng & Units 09/16/2017 09/11/2016 03/16/2016  WBC 3.4 - 10.8 x10E3/uL 13.5(H) 5.8 5.5  Hemoglobin 11.1 - 15.9 g/dL 13.3 14.2 14.0  Hematocrit 34.0 - 46.6 % 39.3 42.5 42.3  Platelets 150 - 450 x10E3/uL 246 225 231    BMP Latest Ref Rng & Units 09/16/2017 09/11/2016 03/16/2016  Glucose 65 - 99 mg/dL 119(H) 92 91  BUN 8 - 27 mg/dL 11 18 16   Creatinine 0.57 - 1.00 mg/dL 0.59 0.68 0.81  BUN/Creat Ratio 12 - 28 19 26 20   Sodium 134 - 144 mmol/L 138 143 140  Potassium 3.5 - 5.2 mmol/L 3.8 4.4 4.4  Chloride 96 - 106 mmol/L 99 104 101  CO2 20 - 29 mmol/L 25 24 23   Calcium 8.7 - 10.3 mg/dL 9.1 9.3 9.3    BNP No results found for: BNP  ProBNP No results found for: PROBNP  PFT    Component Value Date/Time   FEV1PRE 1.92 11/20/2017 1255   FEV1POST 2.02 11/20/2017 1255   FVCPRE 2.23 11/20/2017 1255   FVCPOST 2.21 11/20/2017 1255   TLC 4.25 11/20/2017 1255   DLCOUNC 15.77 11/20/2017 1255   PREFEV1FVCRT 86 11/20/2017 1255   PSTFEV1FVCRT 92 11/20/2017 1255    No results found.   Past medical hx Past Medical History:  Diagnosis Date  . Anxiety   . Degeneration of cervical intervertebral disc   .  Generalized osteoarthrosis, involving multiple sites   . Radiculopathy, cervical   . Rosacea keratitis   . Varicose veins      Social History   Tobacco Use  . Smoking status: Never Smoker  . Smokeless tobacco: Never Used  Substance Use Topics  . Alcohol use: Yes    Comment: occ  . Drug use: No    [name] reports that she has never smoked. She has never used smokeless tobacco. She reports that she drinks alcohol. She reports that she does not use drugs.  Tobacco Cessation: Never smoker  Past surgical hx, Family hx, Social hx all reviewed.  Current Outpatient Medications on File Prior to Visit    Medication Sig  . albuterol (PROVENTIL HFA;VENTOLIN HFA) 108 (90 Base) MCG/ACT inhaler Inhale 2 puffs into the lungs every 6 (six) hours as needed for wheezing or shortness of breath.  . ALPRAZolam (XANAX) 0.5 MG tablet Take 1 tablet (0.5 mg total) by mouth 2 (two) times daily as needed for anxiety.  Marland Kitchen aspirin EC 81 MG tablet Take 81 mg by mouth daily.  . budesonide-formoterol (SYMBICORT) 160-4.5 MCG/ACT inhaler Inhale 2 puffs into the lungs 2 (two) times daily.  . citalopram (CELEXA) 40 MG tablet Take 1 tablet (40 mg total) by mouth daily.  Marland Kitchen donepezil (ARICEPT) 10 MG tablet Take 1 tablet (10 mg total) by mouth at bedtime.  . furosemide (LASIX) 20 MG tablet Take 20 mg by mouth as needed.  Marland Kitchen HYDROcodone-acetaminophen (NORCO/VICODIN) 5-325 MG tablet hydrocodone 5 mg-acetaminophen 325 mg tablet  . ibuprofen (ADVIL,MOTRIN) 800 MG tablet TAKE  (1)  TABLET  THREE TIMES DAILY.  Marland Kitchen omeprazole (PRILOSEC) 20 MG capsule TAKE (1) CAPSULE DAILY  . phentermine (ADIPEX-P) 37.5 MG tablet TAKE 1 TABLET EVERY DAY  . pravastatin (PRAVACHOL) 20 MG tablet Take 20 mg by mouth daily.  . traZODone (DESYREL) 100 MG tablet Take 2-3 tablets (200-300 mg total) by mouth at bedtime.   No current facility-administered medications on file prior to visit.      Allergies  Allergen Reactions  . Acetaminophen Other (See Comments)    Tylenol with codeine only - GI UPSET  . Amoxicillin Rash  . Ampicillin Rash  . Codeine Phosphate Nausea Only    Review Of Systems:  Constitutional:   No  weight loss, night sweats,  Fevers, chills, fatigue, or  lassitude.  HEENT:   No headaches,  Difficulty swallowing,  Tooth/dental problems, or  Sore throat,                No sneezing, itching, ear ache, nasal congestion, + post nasal drip,   CV:  No chest pain,  Orthopnea, PND, swelling in lower extremities, anasarca, dizziness, palpitations, syncope.   GI  + heartburn, indigestion, abdominal pain, nausea, vomiting, diarrhea, change  in bowel habits, loss of appetite, bloody stools.   Resp: No shortness of breath with exertion or at rest.  No excess mucus, + productive cough,  No non-productive cough,  No coughing up of blood.  No change in color of mucus.  No wheezing.  No chest wall deformity  Skin: no rash or lesions.  GU: no dysuria, change in color of urine, no urgency or frequency.  No flank pain, no hematuria   MS:  No joint pain or swelling.  No decreased range of motion.  No back pain.  Psych:  No change in mood or affect. No depression or anxiety.  No memory loss.   Vital Signs BP 120/70 (BP Location:  Left Arm, Cuff Size: Normal)   Pulse 66   Ht 4' 11.75" (1.518 m)   Wt 205 lb 9.6 oz (93.3 kg)   SpO2 94%   BMI 40.49 kg/m    Physical Exam:  General- No distress,  A&Ox3, pleasant ENT: No sinus tenderness, TM clear, pale nasal mucosa, no oral exudate,+ post nasal drip, no LAN Cardiac: S1, S2, regular rate and rhythm, no murmur Chest: No wheeze/ rales/ dullness; no accessory muscle use, no nasal flaring, no sternal retractions Abd.: Soft Non-tender, Body mass index is 40.49 kg/m. Ext: No clubbing cyanosis, + lymph edema BLE Neuro:  normal strength, Cranial nerves intact Skin: No rashes, warm and dry, no lesions Psych: normal mood and behavior   Assessment/Plan  Chronic cough Upper Airway cough at baseline Suspect Allergic and GERD etiology Not using Pepcid at bedtime Plan: I am so sorry to hear about your breast cancer diagnosis.. We will continue the Prilosec daily as you have been doing.  Remember to add pepcid 20 mg at bedtime. Follow GERD diet as able , no caffeine at night. Avoid mint, menthol, spearmint Sips of water instead of throat clearing Sugar Free [employer] or [employer] originals for throat soothing. Delsym Cough syrup 5 cc's every 12 hours Non-sedating antihistamine of your choice daily ( Zyrtec, Allegra, Xyzol, Claritin ( Generic ok) Slightly elevate the head of your  bed with blocks or wedge beneath the mattress. We will send you to an allergist when you have completed therapy for your new breast cancer diagnosis. We will schedule a follow up CT chest in August of 2020( 12 months after previous scan) Follow up with Judson Roch NP, or Dr. Chase Caller in 6 months Please contact office for sooner follow up if symptoms do not improve or worsen or seek emergency care    Pulmonary nodules Follow up CT chest without contrast 10/2018  Breast cancer [facility]) Diagnosed 01/2018>> Biopsy Plan Follow up with oncology and surgery as scheduled    Magdalen Spatz, NP 02/03/2018  12:50 PM

## 2018-02-04 ENCOUNTER — Encounter: Payer: Self-pay | Admitting: *Deleted

## 2018-02-04 ENCOUNTER — Encounter: Payer: Self-pay | Admitting: Physician Assistant

## 2018-02-04 ENCOUNTER — Ambulatory Visit (INDEPENDENT_AMBULATORY_CARE_PROVIDER_SITE_OTHER): Payer: Medicare Other

## 2018-02-04 ENCOUNTER — Ambulatory Visit (INDEPENDENT_AMBULATORY_CARE_PROVIDER_SITE_OTHER): Payer: Medicare Other | Admitting: Physician Assistant

## 2018-02-04 VITALS — BP 131/73 | HR 67 | Temp 98.2°F | Ht 59.0 in | Wt 203.8 lb

## 2018-02-04 DIAGNOSIS — Z13820 Encounter for screening for osteoporosis: Secondary | ICD-10-CM | POA: Diagnosis not present

## 2018-02-04 DIAGNOSIS — Z23 Encounter for immunization: Secondary | ICD-10-CM

## 2018-02-04 DIAGNOSIS — M810 Age-related osteoporosis without current pathological fracture: Secondary | ICD-10-CM

## 2018-02-04 DIAGNOSIS — M1711 Unilateral primary osteoarthritis, right knee: Secondary | ICD-10-CM

## 2018-02-04 DIAGNOSIS — C50412 Malignant neoplasm of upper-outer quadrant of left female breast: Secondary | ICD-10-CM

## 2018-02-04 DIAGNOSIS — M816 Localized osteoporosis [Lequesne]: Secondary | ICD-10-CM | POA: Diagnosis not present

## 2018-02-04 DIAGNOSIS — Z780 Asymptomatic menopausal state: Secondary | ICD-10-CM | POA: Diagnosis not present

## 2018-02-04 DIAGNOSIS — I10 Essential (primary) hypertension: Secondary | ICD-10-CM

## 2018-02-04 DIAGNOSIS — M1712 Unilateral primary osteoarthritis, left knee: Secondary | ICD-10-CM

## 2018-02-04 MED ORDER — TRAZODONE HCL 100 MG PO TABS: 200.0000 mg | ORAL_TABLET | Freq: Every day | ORAL | 3 refills | Status: AC

## 2018-02-04 MED ORDER — ALPRAZOLAM 0.5 MG PO TABS: 0.5000 mg | ORAL_TABLET | Freq: Two times a day (BID) | ORAL | 5 refills | Status: DC | PRN

## 2018-02-04 MED ORDER — HYDROCODONE-ACETAMINOPHEN 5-325 MG PO TABS: 1.0000 | ORAL_TABLET | Freq: Four times a day (QID) | ORAL | 0 refills | Status: DC | PRN

## 2018-02-05 DIAGNOSIS — C50912 Malignant neoplasm of unspecified site of left female breast: Secondary | ICD-10-CM | POA: Diagnosis not present

## 2018-02-05 DIAGNOSIS — Z452 Encounter for adjustment and management of vascular access device: Secondary | ICD-10-CM | POA: Diagnosis not present

## 2018-02-07 DIAGNOSIS — T148XXA Other injury of unspecified body region, initial encounter: Secondary | ICD-10-CM | POA: Diagnosis not present

## 2018-02-07 DIAGNOSIS — I10 Essential (primary) hypertension: Secondary | ICD-10-CM | POA: Diagnosis not present

## 2018-02-07 DIAGNOSIS — Z171 Estrogen receptor negative status [ER-]: Secondary | ICD-10-CM | POA: Diagnosis not present

## 2018-02-07 DIAGNOSIS — C50912 Malignant neoplasm of unspecified site of left female breast: Secondary | ICD-10-CM | POA: Diagnosis not present

## 2018-02-07 DIAGNOSIS — Z5111 Encounter for antineoplastic chemotherapy: Secondary | ICD-10-CM | POA: Diagnosis not present

## 2018-02-07 DIAGNOSIS — C50412 Malignant neoplasm of upper-outer quadrant of left female breast: Secondary | ICD-10-CM | POA: Diagnosis not present

## 2018-02-07 NOTE — Progress Notes (Signed)
BP 131/73   Pulse 67   Temp 98.2 F (36.8 C) (Oral)   Ht 4\' 11"  (1.499 m)   Wt 203 lb 12.8 oz (92.4 kg)   BMI 41.16 kg/m    Subjective:    Patient ID: [name], female    DOB: 05-05-1940, 77 y.o.   MRN: [id number]  HPI: [name] is a 77 y.o. female presenting on 02/04/2018 for Hypertension (3 month follow up )  This patient comes in for periodic recheck on medications and conditions including osteoporosis in need of a DEXA, hypertension, arthritis of the knees, new found malignant neoplasm of the breast.  She will be going to her surgeon this week and oncologist for the first time.  We will await to see what her level treatment will be.  They told her they did not feel it was very large when she had the biopsy performed.  And all of her lymph nodes were negative.  She continues with chronic pain related to joints the rest of her conditions are fairly well controlled.  Medications will be refilled as needed.   All medications are reviewed today. There are no reports of any problems with the medications. All of the medical conditions are reviewed and updated.  Lab work is reviewed and will be ordered as medically necessary. There are no new problems reported with today's visit.   Past Medical History:  Diagnosis Date  . Anxiety   . Degeneration of cervical intervertebral disc   . Generalized osteoarthrosis, involving multiple sites   . Radiculopathy, cervical   . Rosacea keratitis   . Varicose veins    Relevant past medical, surgical, family and social history reviewed and updated as indicated. Interim medical history since our last visit reviewed. Allergies and medications reviewed and updated. DATA REVIEWED: CHART IN EPIC  Family History reviewed for pertinent findings.  Review of Systems  Constitutional: Negative.  Negative for activity change, fatigue and fever.  HENT: Negative.   Eyes: Negative.   Respiratory: Negative.  Negative for cough.   Cardiovascular:  Negative.  Negative for chest pain.  Gastrointestinal: Negative.  Negative for abdominal pain.  Endocrine: Negative.   Genitourinary: Negative.  Negative for dysuria.  Musculoskeletal: Positive for arthralgias, back pain and joint swelling.  Skin: Negative.   Neurological: Negative.     Allergies as of 02/04/2018      Reactions   Acetaminophen Other (See Comments)   Tylenol with codeine only - GI UPSET   Amoxicillin Rash   Ampicillin Rash   Codeine Phosphate Nausea Only      Medication List        Accurate as of 02/04/18 11:59 PM. Always use your most recent med list.          albuterol 108 (90 Base) MCG/ACT inhaler Commonly known as:  PROVENTIL HFA;VENTOLIN HFA Inhale 2 puffs into the lungs every 6 (six) hours as needed for wheezing or shortness of breath.   ALPRAZolam 0.5 MG tablet Commonly known as:  XANAX Take 1 tablet (0.5 mg total) by mouth 2 (two) times daily as needed for anxiety.   aspirin EC 81 MG tablet Take 81 mg by mouth daily.   budesonide-formoterol 160-4.5 MCG/ACT inhaler Commonly known as:  SYMBICORT Inhale 2 puffs into the lungs 2 (two) times daily.   citalopram 40 MG tablet Commonly known as:  CELEXA Take 1 tablet (40 mg total) by mouth daily.   donepezil 10 MG tablet Commonly known as:  ARICEPT Take 1  tablet (10 mg total) by mouth at bedtime.   famotidine 10 MG tablet Commonly known as:  PEPCID Take 10 mg by mouth 2 (two) times daily.   furosemide 20 MG tablet Commonly known as:  LASIX Take 20 mg by mouth as needed.   HYDROcodone-acetaminophen 5-325 MG tablet Commonly known as:  NORCO/VICODIN Take 1-2 tablets by mouth every 6 (six) hours as needed for moderate pain.   ibuprofen 800 MG tablet Commonly known as:  ADVIL,MOTRIN TAKE  (1)  TABLET  THREE TIMES DAILY.   omeprazole 20 MG capsule Commonly known as:  PRILOSEC TAKE (1) CAPSULE DAILY   pravastatin 20 MG tablet Commonly known as:  PRAVACHOL Take 20 mg by mouth daily.     traZODone 100 MG tablet Commonly known as:  DESYREL Take 2-3 tablets (200-300 mg total) by mouth at bedtime.          Objective:    BP 131/73   Pulse 67   Temp 98.2 F (36.8 C) (Oral)   Ht 4\' 11"  (1.499 m)   Wt 203 lb 12.8 oz (92.4 kg)   BMI 41.16 kg/m   Allergies  Allergen Reactions  . Acetaminophen Other (See Comments)    Tylenol with codeine only - GI UPSET  . Amoxicillin Rash  . Ampicillin Rash  . Codeine Phosphate Nausea Only    Wt Readings from Last 3 Encounters:  02/04/18 203 lb 12.8 oz (92.4 kg)  02/03/18 205 lb 9.6 oz (93.3 kg)  11/20/17 206 lb (93.4 kg)    Physical Exam  Constitutional: She is oriented to person, place, and time. She appears well-developed and well-nourished.  HENT:  Head: Normocephalic and atraumatic.  Eyes: Pupils are equal, round, and reactive to light. Conjunctivae and EOM are normal.  Cardiovascular: Normal rate, regular rhythm, normal heart sounds and intact distal pulses.  Pulmonary/Chest: Effort normal and breath sounds normal.  Abdominal: Soft. Bowel sounds are normal.  Neurological: She is alert and oriented to person, place, and time. She has normal reflexes.  Skin: Skin is warm and dry. No rash noted.  Psychiatric: She has a normal mood and affect. Her behavior is normal. Judgment and thought content normal.        Assessment & Plan:   1. Localized osteoporosis, unspecified pathological fracture presence - DG [facility] DEXA  2. Essential hypertension Continue medications  3. Primary osteoarthritis of right knee - HYDROcodone-acetaminophen (NORCO/VICODIN) 5-325 MG tablet; Take 1-2 tablets by mouth every 6 (six) hours as needed for moderate pain.  Dispense: 60 tablet; Refill: 0  4. Primary osteoarthritis of left knee - HYDROcodone-acetaminophen (NORCO/VICODIN) 5-325 MG tablet; Take 1-2 tablets by mouth every 6 (six) hours as needed for moderate pain.  Dispense: 60 tablet; Refill: 0  5. Malignant neoplasm of upper-outer  quadrant of left female breast, unspecified estrogen receptor status ([place]) Follow with oncology and surgery with [facility]  6. Encounter for immunization - Flu vaccine HIGH DOSE PF   Continue all other maintenance medications as listed above.  Follow up plan: Return in about 6 months (around 08/06/2018) for recheck.  Educational handout given for [place] PA-C [place] [address]  [place], [place] [address] [phone]   02/07/2018, 1:31 PM

## 2018-02-10 DIAGNOSIS — C50412 Malignant neoplasm of upper-outer quadrant of left female breast: Secondary | ICD-10-CM | POA: Diagnosis not present

## 2018-02-10 DIAGNOSIS — Z171 Estrogen receptor negative status [ER-]: Secondary | ICD-10-CM | POA: Diagnosis not present

## 2018-02-10 DIAGNOSIS — Z803 Family history of malignant neoplasm of breast: Secondary | ICD-10-CM | POA: Diagnosis not present

## 2018-02-13 DIAGNOSIS — I088 Other rheumatic multiple valve diseases: Secondary | ICD-10-CM | POA: Diagnosis not present

## 2018-02-13 DIAGNOSIS — Z171 Estrogen receptor negative status [ER-]: Secondary | ICD-10-CM | POA: Diagnosis not present

## 2018-02-13 DIAGNOSIS — C50412 Malignant neoplasm of upper-outer quadrant of left female breast: Secondary | ICD-10-CM | POA: Diagnosis not present

## 2018-02-17 DIAGNOSIS — Z881 Allergy status to other antibiotic agents status: Secondary | ICD-10-CM | POA: Diagnosis not present

## 2018-02-17 DIAGNOSIS — K219 Gastro-esophageal reflux disease without esophagitis: Secondary | ICD-10-CM | POA: Diagnosis not present

## 2018-02-17 DIAGNOSIS — Z885 Allergy status to narcotic agent status: Secondary | ICD-10-CM | POA: Diagnosis not present

## 2018-02-17 DIAGNOSIS — Z452 Encounter for adjustment and management of vascular access device: Secondary | ICD-10-CM | POA: Diagnosis not present

## 2018-02-17 DIAGNOSIS — Z79899 Other long term (current) drug therapy: Secondary | ICD-10-CM | POA: Diagnosis not present

## 2018-02-17 DIAGNOSIS — Z7951 Long term (current) use of inhaled steroids: Secondary | ICD-10-CM | POA: Diagnosis not present

## 2018-02-17 DIAGNOSIS — Z7982 Long term (current) use of aspirin: Secondary | ICD-10-CM | POA: Diagnosis not present

## 2018-02-17 DIAGNOSIS — E785 Hyperlipidemia, unspecified: Secondary | ICD-10-CM | POA: Diagnosis not present

## 2018-02-17 DIAGNOSIS — I10 Essential (primary) hypertension: Secondary | ICD-10-CM | POA: Diagnosis not present

## 2018-02-17 DIAGNOSIS — Z171 Estrogen receptor negative status [ER-]: Secondary | ICD-10-CM | POA: Diagnosis not present

## 2018-02-17 DIAGNOSIS — C50912 Malignant neoplasm of unspecified site of left female breast: Secondary | ICD-10-CM | POA: Diagnosis not present

## 2018-02-19 DIAGNOSIS — Z452 Encounter for adjustment and management of vascular access device: Secondary | ICD-10-CM | POA: Diagnosis not present

## 2018-02-19 DIAGNOSIS — E041 Nontoxic single thyroid nodule: Secondary | ICD-10-CM | POA: Diagnosis not present

## 2018-02-19 DIAGNOSIS — R918 Other nonspecific abnormal finding of lung field: Secondary | ICD-10-CM | POA: Diagnosis not present

## 2018-02-19 DIAGNOSIS — N281 Cyst of kidney, acquired: Secondary | ICD-10-CM | POA: Diagnosis not present

## 2018-02-19 DIAGNOSIS — M1990 Unspecified osteoarthritis, unspecified site: Secondary | ICD-10-CM | POA: Diagnosis not present

## 2018-02-19 DIAGNOSIS — C50412 Malignant neoplasm of upper-outer quadrant of left female breast: Secondary | ICD-10-CM | POA: Diagnosis not present

## 2018-02-19 DIAGNOSIS — Z171 Estrogen receptor negative status [ER-]: Secondary | ICD-10-CM | POA: Diagnosis not present

## 2018-02-19 DIAGNOSIS — K5730 Diverticulosis of large intestine without perforation or abscess without bleeding: Secondary | ICD-10-CM | POA: Diagnosis not present

## 2018-02-20 DIAGNOSIS — Z5111 Encounter for antineoplastic chemotherapy: Secondary | ICD-10-CM | POA: Diagnosis not present

## 2018-02-20 DIAGNOSIS — C50912 Malignant neoplasm of unspecified site of left female breast: Secondary | ICD-10-CM | POA: Diagnosis not present

## 2018-02-20 DIAGNOSIS — Z171 Estrogen receptor negative status [ER-]: Secondary | ICD-10-CM | POA: Diagnosis not present

## 2018-02-20 DIAGNOSIS — I10 Essential (primary) hypertension: Secondary | ICD-10-CM | POA: Diagnosis not present

## 2018-02-20 DIAGNOSIS — T148XXA Other injury of unspecified body region, initial encounter: Secondary | ICD-10-CM | POA: Diagnosis not present

## 2018-02-20 DIAGNOSIS — C50412 Malignant neoplasm of upper-outer quadrant of left female breast: Secondary | ICD-10-CM | POA: Diagnosis not present

## 2018-02-21 DIAGNOSIS — R2231 Localized swelling, mass and lump, right upper limb: Secondary | ICD-10-CM | POA: Diagnosis not present

## 2018-02-21 DIAGNOSIS — T148XXA Other injury of unspecified body region, initial encounter: Secondary | ICD-10-CM | POA: Diagnosis not present

## 2018-02-21 DIAGNOSIS — Z452 Encounter for adjustment and management of vascular access device: Secondary | ICD-10-CM | POA: Diagnosis not present

## 2018-02-24 DIAGNOSIS — Z5111 Encounter for antineoplastic chemotherapy: Secondary | ICD-10-CM | POA: Diagnosis not present

## 2018-02-24 DIAGNOSIS — C50412 Malignant neoplasm of upper-outer quadrant of left female breast: Secondary | ICD-10-CM | POA: Diagnosis not present

## 2018-02-24 DIAGNOSIS — Z171 Estrogen receptor negative status [ER-]: Secondary | ICD-10-CM | POA: Diagnosis not present

## 2018-02-27 DIAGNOSIS — C50412 Malignant neoplasm of upper-outer quadrant of left female breast: Secondary | ICD-10-CM | POA: Diagnosis not present

## 2018-02-27 DIAGNOSIS — Z5111 Encounter for antineoplastic chemotherapy: Secondary | ICD-10-CM | POA: Diagnosis not present

## 2018-02-27 DIAGNOSIS — Z171 Estrogen receptor negative status [ER-]: Secondary | ICD-10-CM | POA: Diagnosis not present

## 2018-03-06 DIAGNOSIS — Z87898 Personal history of other specified conditions: Secondary | ICD-10-CM | POA: Diagnosis not present

## 2018-03-06 DIAGNOSIS — C50412 Malignant neoplasm of upper-outer quadrant of left female breast: Secondary | ICD-10-CM | POA: Diagnosis not present

## 2018-03-06 DIAGNOSIS — Z171 Estrogen receptor negative status [ER-]: Secondary | ICD-10-CM | POA: Diagnosis not present

## 2018-03-06 DIAGNOSIS — K521 Toxic gastroenteritis and colitis: Secondary | ICD-10-CM | POA: Diagnosis not present

## 2018-03-06 DIAGNOSIS — Z5111 Encounter for antineoplastic chemotherapy: Secondary | ICD-10-CM | POA: Diagnosis not present

## 2018-03-06 DIAGNOSIS — I10 Essential (primary) hypertension: Secondary | ICD-10-CM | POA: Diagnosis not present

## 2018-03-06 DIAGNOSIS — T451X5A Adverse effect of antineoplastic and immunosuppressive drugs, initial encounter: Secondary | ICD-10-CM | POA: Diagnosis not present

## 2018-03-06 DIAGNOSIS — D701 Agranulocytosis secondary to cancer chemotherapy: Secondary | ICD-10-CM | POA: Diagnosis not present

## 2018-03-06 DIAGNOSIS — E876 Hypokalemia: Secondary | ICD-10-CM | POA: Diagnosis not present

## 2018-03-13 DIAGNOSIS — I10 Essential (primary) hypertension: Secondary | ICD-10-CM | POA: Diagnosis not present

## 2018-03-13 DIAGNOSIS — D701 Agranulocytosis secondary to cancer chemotherapy: Secondary | ICD-10-CM | POA: Diagnosis not present

## 2018-03-13 DIAGNOSIS — E876 Hypokalemia: Secondary | ICD-10-CM | POA: Diagnosis not present

## 2018-03-13 DIAGNOSIS — Z171 Estrogen receptor negative status [ER-]: Secondary | ICD-10-CM | POA: Diagnosis not present

## 2018-03-13 DIAGNOSIS — K521 Toxic gastroenteritis and colitis: Secondary | ICD-10-CM | POA: Diagnosis not present

## 2018-03-13 DIAGNOSIS — C50412 Malignant neoplasm of upper-outer quadrant of left female breast: Secondary | ICD-10-CM | POA: Diagnosis not present

## 2018-03-13 DIAGNOSIS — Z87898 Personal history of other specified conditions: Secondary | ICD-10-CM | POA: Diagnosis not present

## 2018-03-13 DIAGNOSIS — Z5111 Encounter for antineoplastic chemotherapy: Secondary | ICD-10-CM | POA: Diagnosis not present

## 2018-03-13 DIAGNOSIS — T451X5A Adverse effect of antineoplastic and immunosuppressive drugs, initial encounter: Secondary | ICD-10-CM | POA: Diagnosis not present

## 2018-03-20 DIAGNOSIS — Z87898 Personal history of other specified conditions: Secondary | ICD-10-CM | POA: Diagnosis not present

## 2018-03-20 DIAGNOSIS — Z171 Estrogen receptor negative status [ER-]: Secondary | ICD-10-CM | POA: Diagnosis not present

## 2018-03-20 DIAGNOSIS — I10 Essential (primary) hypertension: Secondary | ICD-10-CM | POA: Diagnosis not present

## 2018-03-20 DIAGNOSIS — C50412 Malignant neoplasm of upper-outer quadrant of left female breast: Secondary | ICD-10-CM | POA: Diagnosis not present

## 2018-03-20 DIAGNOSIS — T451X5A Adverse effect of antineoplastic and immunosuppressive drugs, initial encounter: Secondary | ICD-10-CM | POA: Diagnosis not present

## 2018-03-20 DIAGNOSIS — D701 Agranulocytosis secondary to cancer chemotherapy: Secondary | ICD-10-CM | POA: Diagnosis not present

## 2018-03-20 DIAGNOSIS — E876 Hypokalemia: Secondary | ICD-10-CM | POA: Diagnosis not present

## 2018-03-20 DIAGNOSIS — K521 Toxic gastroenteritis and colitis: Secondary | ICD-10-CM | POA: Diagnosis not present

## 2018-03-20 DIAGNOSIS — Z5111 Encounter for antineoplastic chemotherapy: Secondary | ICD-10-CM | POA: Diagnosis not present

## 2018-03-25 ENCOUNTER — Other Ambulatory Visit: Payer: Self-pay | Admitting: Acute Care

## 2018-03-27 DIAGNOSIS — D701 Agranulocytosis secondary to cancer chemotherapy: Secondary | ICD-10-CM | POA: Diagnosis not present

## 2018-03-27 DIAGNOSIS — Z171 Estrogen receptor negative status [ER-]: Secondary | ICD-10-CM | POA: Diagnosis not present

## 2018-03-27 DIAGNOSIS — C50412 Malignant neoplasm of upper-outer quadrant of left female breast: Secondary | ICD-10-CM | POA: Diagnosis not present

## 2018-03-27 DIAGNOSIS — E876 Hypokalemia: Secondary | ICD-10-CM | POA: Diagnosis not present

## 2018-03-27 DIAGNOSIS — Z87898 Personal history of other specified conditions: Secondary | ICD-10-CM | POA: Diagnosis not present

## 2018-03-27 DIAGNOSIS — K521 Toxic gastroenteritis and colitis: Secondary | ICD-10-CM | POA: Diagnosis not present

## 2018-03-27 DIAGNOSIS — I10 Essential (primary) hypertension: Secondary | ICD-10-CM | POA: Diagnosis not present

## 2018-03-27 DIAGNOSIS — T451X5A Adverse effect of antineoplastic and immunosuppressive drugs, initial encounter: Secondary | ICD-10-CM | POA: Diagnosis not present

## 2018-03-27 DIAGNOSIS — Z5111 Encounter for antineoplastic chemotherapy: Secondary | ICD-10-CM | POA: Diagnosis not present

## 2018-04-03 DIAGNOSIS — Z171 Estrogen receptor negative status [ER-]: Secondary | ICD-10-CM | POA: Diagnosis not present

## 2018-04-03 DIAGNOSIS — Z87898 Personal history of other specified conditions: Secondary | ICD-10-CM | POA: Diagnosis not present

## 2018-04-03 DIAGNOSIS — E876 Hypokalemia: Secondary | ICD-10-CM | POA: Diagnosis not present

## 2018-04-03 DIAGNOSIS — Z5111 Encounter for antineoplastic chemotherapy: Secondary | ICD-10-CM | POA: Diagnosis not present

## 2018-04-03 DIAGNOSIS — K521 Toxic gastroenteritis and colitis: Secondary | ICD-10-CM | POA: Diagnosis not present

## 2018-04-03 DIAGNOSIS — C50412 Malignant neoplasm of upper-outer quadrant of left female breast: Secondary | ICD-10-CM | POA: Diagnosis not present

## 2018-04-03 DIAGNOSIS — I10 Essential (primary) hypertension: Secondary | ICD-10-CM | POA: Diagnosis not present

## 2018-04-03 DIAGNOSIS — D701 Agranulocytosis secondary to cancer chemotherapy: Secondary | ICD-10-CM | POA: Diagnosis not present

## 2018-04-03 DIAGNOSIS — T451X5A Adverse effect of antineoplastic and immunosuppressive drugs, initial encounter: Secondary | ICD-10-CM | POA: Diagnosis not present

## 2018-04-04 ENCOUNTER — Telehealth: Payer: Self-pay | Admitting: Physician Assistant

## 2018-04-04 DIAGNOSIS — R531 Weakness: Secondary | ICD-10-CM

## 2018-04-04 DIAGNOSIS — M1712 Unilateral primary osteoarthritis, left knee: Secondary | ICD-10-CM

## 2018-04-04 NOTE — Telephone Encounter (Signed)
OA weajness High risk falls  Okay to do order

## 2018-04-05 DIAGNOSIS — R300 Dysuria: Secondary | ICD-10-CM | POA: Diagnosis not present

## 2018-04-05 DIAGNOSIS — N3000 Acute cystitis without hematuria: Secondary | ICD-10-CM | POA: Diagnosis not present

## 2018-04-07 NOTE — Telephone Encounter (Signed)
Detailed message left for patient that rx is up front.

## 2018-04-10 ENCOUNTER — Other Ambulatory Visit: Payer: Self-pay | Admitting: Physician Assistant

## 2018-04-10 ENCOUNTER — Telehealth: Payer: Self-pay | Admitting: Physician Assistant

## 2018-04-10 DIAGNOSIS — Z5111 Encounter for antineoplastic chemotherapy: Secondary | ICD-10-CM | POA: Diagnosis not present

## 2018-04-10 DIAGNOSIS — I10 Essential (primary) hypertension: Secondary | ICD-10-CM | POA: Diagnosis not present

## 2018-04-10 DIAGNOSIS — T451X5A Adverse effect of antineoplastic and immunosuppressive drugs, initial encounter: Secondary | ICD-10-CM | POA: Diagnosis not present

## 2018-04-10 DIAGNOSIS — E876 Hypokalemia: Secondary | ICD-10-CM | POA: Diagnosis not present

## 2018-04-10 DIAGNOSIS — K219 Gastro-esophageal reflux disease without esophagitis: Secondary | ICD-10-CM | POA: Diagnosis not present

## 2018-04-10 DIAGNOSIS — C50412 Malignant neoplasm of upper-outer quadrant of left female breast: Secondary | ICD-10-CM | POA: Diagnosis not present

## 2018-04-10 DIAGNOSIS — D701 Agranulocytosis secondary to cancer chemotherapy: Secondary | ICD-10-CM | POA: Diagnosis not present

## 2018-04-10 DIAGNOSIS — Z87898 Personal history of other specified conditions: Secondary | ICD-10-CM | POA: Diagnosis not present

## 2018-04-10 DIAGNOSIS — Z171 Estrogen receptor negative status [ER-]: Secondary | ICD-10-CM | POA: Diagnosis not present

## 2018-04-10 MED ORDER — VALACYCLOVIR HCL 1 G PO TABS: 1000.0000 mg | ORAL_TABLET | Freq: Two times a day (BID) | ORAL | 0 refills | Status: AC

## 2018-04-10 NOTE — Telephone Encounter (Signed)
sent 

## 2018-04-10 NOTE — Telephone Encounter (Signed)
Patient aware.

## 2018-04-17 DIAGNOSIS — Z87898 Personal history of other specified conditions: Secondary | ICD-10-CM | POA: Diagnosis not present

## 2018-04-17 DIAGNOSIS — Z171 Estrogen receptor negative status [ER-]: Secondary | ICD-10-CM | POA: Diagnosis not present

## 2018-04-17 DIAGNOSIS — T451X5A Adverse effect of antineoplastic and immunosuppressive drugs, initial encounter: Secondary | ICD-10-CM | POA: Diagnosis not present

## 2018-04-17 DIAGNOSIS — D701 Agranulocytosis secondary to cancer chemotherapy: Secondary | ICD-10-CM | POA: Diagnosis not present

## 2018-04-17 DIAGNOSIS — I10 Essential (primary) hypertension: Secondary | ICD-10-CM | POA: Diagnosis not present

## 2018-04-17 DIAGNOSIS — C50412 Malignant neoplasm of upper-outer quadrant of left female breast: Secondary | ICD-10-CM | POA: Diagnosis not present

## 2018-04-17 DIAGNOSIS — K219 Gastro-esophageal reflux disease without esophagitis: Secondary | ICD-10-CM | POA: Diagnosis not present

## 2018-04-17 DIAGNOSIS — Z5111 Encounter for antineoplastic chemotherapy: Secondary | ICD-10-CM | POA: Diagnosis not present

## 2018-04-17 DIAGNOSIS — E876 Hypokalemia: Secondary | ICD-10-CM | POA: Diagnosis not present

## 2018-04-22 ENCOUNTER — Other Ambulatory Visit: Payer: Self-pay | Admitting: Acute Care

## 2018-04-22 ENCOUNTER — Other Ambulatory Visit: Payer: Self-pay | Admitting: Physician Assistant

## 2018-04-22 DIAGNOSIS — F339 Major depressive disorder, recurrent, unspecified: Secondary | ICD-10-CM

## 2018-04-23 ENCOUNTER — Encounter: Payer: Self-pay | Admitting: Physician Assistant

## 2018-04-23 ENCOUNTER — Ambulatory Visit (INDEPENDENT_AMBULATORY_CARE_PROVIDER_SITE_OTHER): Payer: Medicare Other | Admitting: Physician Assistant

## 2018-04-23 VITALS — BP 129/76 | HR 81 | Temp 96.9°F | Ht 59.0 in | Wt 203.0 lb

## 2018-04-23 DIAGNOSIS — M1711 Unilateral primary osteoarthritis, right knee: Secondary | ICD-10-CM

## 2018-04-23 DIAGNOSIS — C50919 Malignant neoplasm of unspecified site of unspecified female breast: Secondary | ICD-10-CM | POA: Diagnosis not present

## 2018-04-23 DIAGNOSIS — F339 Major depressive disorder, recurrent, unspecified: Secondary | ICD-10-CM

## 2018-04-23 DIAGNOSIS — M1712 Unilateral primary osteoarthritis, left knee: Secondary | ICD-10-CM | POA: Diagnosis not present

## 2018-04-23 MED ORDER — HYDROCODONE-ACETAMINOPHEN 5-325 MG PO TABS: 1.0000 | ORAL_TABLET | Freq: Four times a day (QID) | ORAL | 0 refills | Status: DC | PRN

## 2018-04-23 MED ORDER — AZITHROMYCIN 250 MG PO TABS: ORAL_TABLET | ORAL | 0 refills | Status: DC

## 2018-04-24 DIAGNOSIS — I10 Essential (primary) hypertension: Secondary | ICD-10-CM | POA: Diagnosis not present

## 2018-04-24 DIAGNOSIS — C50412 Malignant neoplasm of upper-outer quadrant of left female breast: Secondary | ICD-10-CM | POA: Diagnosis not present

## 2018-04-24 DIAGNOSIS — Z87898 Personal history of other specified conditions: Secondary | ICD-10-CM | POA: Diagnosis not present

## 2018-04-24 DIAGNOSIS — E876 Hypokalemia: Secondary | ICD-10-CM | POA: Diagnosis not present

## 2018-04-24 DIAGNOSIS — Z171 Estrogen receptor negative status [ER-]: Secondary | ICD-10-CM | POA: Diagnosis not present

## 2018-04-24 DIAGNOSIS — K219 Gastro-esophageal reflux disease without esophagitis: Secondary | ICD-10-CM | POA: Diagnosis not present

## 2018-04-24 DIAGNOSIS — D701 Agranulocytosis secondary to cancer chemotherapy: Secondary | ICD-10-CM | POA: Diagnosis not present

## 2018-04-24 DIAGNOSIS — T451X5A Adverse effect of antineoplastic and immunosuppressive drugs, initial encounter: Secondary | ICD-10-CM | POA: Diagnosis not present

## 2018-04-24 DIAGNOSIS — Z5111 Encounter for antineoplastic chemotherapy: Secondary | ICD-10-CM | POA: Diagnosis not present

## 2018-04-24 NOTE — Progress Notes (Signed)
BP 129/76   Pulse 81   Temp (!) 96.9 F (36.1 C) (Oral)   Ht 4\' 11"  (1.499 m)   Wt 203 lb (92.1 kg)   BMI 41.00 kg/m    Subjective:    Patient ID: [name], female    DOB: [birth year], 78 y.o.   MRN: [id number]  HPI: [name] is a 78 y.o. female presenting on 04/23/2018 for Sore Throat and discuss medications  This patient comes in for periodic recheck on medications and conditions including OA of both knees,breat cancer, depression. She needs refills on mediations  This patient returns for a 3 month recheck on narcotic use for chronic OA knee pain and DDD and medication refills  Patient currently taking hydrocodone 5. Behavior- normal Medication side effects- no Any concerns- no  PMP AWARE website reviewed: Yes Any suspicious activity on PMP Aware: No MME daily dose: 20 MME .   All medications are reviewed today. There are no reports of any problems with the medications. All of the medical conditions are reviewed and updated.  Lab work is reviewed and will be ordered as medically necessary. There are no new problems reported with today's visit.   Past Medical History:  Diagnosis Date  . Anxiety   . Degeneration of cervical intervertebral disc   . Generalized osteoarthrosis, involving multiple sites   . Radiculopathy, cervical   . Rosacea keratitis   . Varicose veins    Relevant past medical, surgical, family and social history reviewed and updated as indicated. Interim medical history since our last visit reviewed. Allergies and medications reviewed and updated. DATA REVIEWED: CHART IN EPIC  Family History reviewed for pertinent findings.  Review of Systems  Constitutional: Positive for fatigue. Negative for activity change and fever.  HENT: Negative.   Eyes: Negative.   Respiratory: Negative.  Negative for cough.   Cardiovascular: Negative.  Negative for chest pain.  Gastrointestinal: Negative.  Negative for abdominal pain.  Endocrine: Negative.    Genitourinary: Negative.  Negative for dysuria.  Musculoskeletal: Positive for arthralgias, back pain, joint swelling and myalgias.  Skin: Negative.   Neurological: Positive for headaches.    Allergies as of 04/23/2018      Reactions   Acetaminophen Other (See Comments)   Tylenol with codeine only - GI UPSET   Amoxicillin Rash   Ampicillin Rash   Codeine Phosphate Nausea Only      Medication List       Accurate as of April 23, 2018 11:59 PM. Always use your most recent med list.        albuterol 108 (90 Base) MCG/ACT inhaler Commonly known as:  PROVENTIL HFA;VENTOLIN HFA Inhale 2 puffs into the lungs every 6 (six) hours as needed for wheezing or shortness of breath.   ALPRAZolam 0.5 MG tablet Commonly known as:  XANAX Take 1 tablet (0.5 mg total) by mouth 2 (two) times daily as needed for anxiety.   aspirin EC 81 MG tablet Take 81 mg by mouth daily.   azithromycin 250 MG tablet Commonly known as:  ZITHROMAX Z-PAK Take as directed   budesonide-formoterol 160-4.5 MCG/ACT inhaler Commonly known as:  SYMBICORT Inhale 2 puffs into the lungs 2 (two) times daily.   citalopram 40 MG tablet Commonly known as:  CELEXA Take 1 tablet (40 mg total) by mouth daily.   donepezil 10 MG tablet Commonly known as:  ARICEPT Take 1 tablet (10 mg total) by mouth at bedtime.   famotidine 10 MG tablet Commonly known as:  PEPCID Take 10 mg by mouth 2 (two) times daily.   furosemide 20 MG tablet Commonly known as:  LASIX Take 20 mg by mouth as needed.   HYDROcodone-acetaminophen 5-325 MG tablet Commonly known as:  NORCO/VICODIN Take 1-2 tablets by mouth every 6 (six) hours as needed for moderate pain.   HYDROcodone-acetaminophen 5-325 MG tablet Commonly known as:  NORCO Take 1 tablet by mouth every 6 (six) hours as needed for moderate pain.   HYDROcodone-acetaminophen 5-325 MG tablet Commonly known as:  NORCO Take 1 tablet by mouth every 6 (six) hours as needed for  moderate pain.   ibuprofen 800 MG tablet Commonly known as:  ADVIL,MOTRIN TAKE  (1)  TABLET  THREE TIMES DAILY.   omeprazole 20 MG capsule Commonly known as:  PRILOSEC TAKE (1) CAPSULE DAILY   pravastatin 20 MG tablet Commonly known as:  PRAVACHOL Take 20 mg by mouth daily.   traZODone 100 MG tablet Commonly known as:  DESYREL Take 2-3 tablets (200-300 mg total) by mouth at bedtime.   valACYclovir 1000 MG tablet Commonly known as:  VALTREX Take 1 tablet (1,000 mg total) by mouth 2 (two) times daily.          Objective:    BP 129/76   Pulse 81   Temp (!) 96.9 F (36.1 C) (Oral)   Ht 4\' 11"  (1.499 m)   Wt 203 lb (92.1 kg)   BMI 41.00 kg/m   Allergies  Allergen Reactions  . Acetaminophen Other (See Comments)    Tylenol with codeine only - GI UPSET  . Amoxicillin Rash  . Ampicillin Rash  . Codeine Phosphate Nausea Only    Wt Readings from Last 3 Encounters:  04/23/18 203 lb (92.1 kg)  02/04/18 203 lb 12.8 oz (92.4 kg)  02/03/18 205 lb 9.6 oz (93.3 kg)    Physical Exam Constitutional:      Appearance: She is well-developed.  HENT:     Head: Normocephalic and atraumatic.  Eyes:     Conjunctiva/sclera: Conjunctivae normal.     Pupils: Pupils are equal, round, and reactive to light.  Cardiovascular:     Rate and Rhythm: Normal rate and regular rhythm.     Heart sounds: Normal heart sounds.  Pulmonary:     Effort: Pulmonary effort is normal.     Breath sounds: Normal breath sounds.  Abdominal:     General: Bowel sounds are normal.     Palpations: Abdomen is soft.  Skin:    General: Skin is warm and dry.     Findings: No rash.  Neurological:     Mental Status: She is alert and oriented to person, place, and time.     Deep Tendon Reflexes: Reflexes are normal and symmetric.  Psychiatric:        Behavior: Behavior normal.        Thought Content: Thought content normal.        Judgment: Judgment normal.         Assessment & Plan:   1. Primary  osteoarthritis of right knee - HYDROcodone-acetaminophen (NORCO/VICODIN) 5-325 MG tablet; Take 1-2 tablets by mouth every 6 (six) hours as needed for moderate pain.  Dispense: 60 tablet; Refill: 0 - HYDROcodone-acetaminophen (NORCO) 5-325 MG tablet; Take 1 tablet by mouth every 6 (six) hours as needed for moderate pain.  Dispense: 60 tablet; Refill: 0 - HYDROcodone-acetaminophen (NORCO) 5-325 MG tablet; Take 1 tablet by mouth every 6 (six) hours as needed for moderate pain.  Dispense: 60  tablet; Refill: 0  2. Primary osteoarthritis of left knee - HYDROcodone-acetaminophen (NORCO/VICODIN) 5-325 MG tablet; Take 1-2 tablets by mouth every 6 (six) hours as needed for moderate pain.  Dispense: 60 tablet; Refill: 0 - HYDROcodone-acetaminophen (NORCO) 5-325 MG tablet; Take 1 tablet by mouth every 6 (six) hours as needed for moderate pain.  Dispense: 60 tablet; Refill: 0 - HYDROcodone-acetaminophen (NORCO) 5-325 MG tablet; Take 1 tablet by mouth every 6 (six) hours as needed for moderate pain.  Dispense: 60 tablet; Refill: 0  3. Malignant neoplasm of female breast, unspecified estrogen receptor status, unspecified laterality, unspecified site of breast [facility]) Continue oncology treatment  4. Depression, recurrent ([place]) Continue medications   Continue all other maintenance medications as listed above.  Follow up plan: Return in about 6 months (around 10/22/2018).  Educational handout given for [place] PA-C [place] [address]  [place], [place] [address] [phone]   04/24/2018, 8:29 PM

## 2018-05-01 DIAGNOSIS — Z5111 Encounter for antineoplastic chemotherapy: Secondary | ICD-10-CM | POA: Diagnosis not present

## 2018-05-01 DIAGNOSIS — K219 Gastro-esophageal reflux disease without esophagitis: Secondary | ICD-10-CM | POA: Diagnosis not present

## 2018-05-01 DIAGNOSIS — T451X5A Adverse effect of antineoplastic and immunosuppressive drugs, initial encounter: Secondary | ICD-10-CM | POA: Diagnosis not present

## 2018-05-01 DIAGNOSIS — C50412 Malignant neoplasm of upper-outer quadrant of left female breast: Secondary | ICD-10-CM | POA: Diagnosis not present

## 2018-05-01 DIAGNOSIS — I10 Essential (primary) hypertension: Secondary | ICD-10-CM | POA: Diagnosis not present

## 2018-05-01 DIAGNOSIS — D701 Agranulocytosis secondary to cancer chemotherapy: Secondary | ICD-10-CM | POA: Diagnosis not present

## 2018-05-01 DIAGNOSIS — Z171 Estrogen receptor negative status [ER-]: Secondary | ICD-10-CM | POA: Diagnosis not present

## 2018-05-01 DIAGNOSIS — Z87898 Personal history of other specified conditions: Secondary | ICD-10-CM | POA: Diagnosis not present

## 2018-05-01 DIAGNOSIS — E876 Hypokalemia: Secondary | ICD-10-CM | POA: Diagnosis not present

## 2018-05-08 DIAGNOSIS — Z5111 Encounter for antineoplastic chemotherapy: Secondary | ICD-10-CM | POA: Diagnosis not present

## 2018-05-08 DIAGNOSIS — I10 Essential (primary) hypertension: Secondary | ICD-10-CM | POA: Diagnosis not present

## 2018-05-08 DIAGNOSIS — E875 Hyperkalemia: Secondary | ICD-10-CM | POA: Diagnosis not present

## 2018-05-08 DIAGNOSIS — T451X5A Adverse effect of antineoplastic and immunosuppressive drugs, initial encounter: Secondary | ICD-10-CM | POA: Diagnosis not present

## 2018-05-08 DIAGNOSIS — Z171 Estrogen receptor negative status [ER-]: Secondary | ICD-10-CM | POA: Diagnosis not present

## 2018-05-08 DIAGNOSIS — C50412 Malignant neoplasm of upper-outer quadrant of left female breast: Secondary | ICD-10-CM | POA: Diagnosis not present

## 2018-05-08 DIAGNOSIS — D701 Agranulocytosis secondary to cancer chemotherapy: Secondary | ICD-10-CM | POA: Diagnosis not present

## 2018-05-15 DIAGNOSIS — Z5111 Encounter for antineoplastic chemotherapy: Secondary | ICD-10-CM | POA: Diagnosis not present

## 2018-05-15 DIAGNOSIS — E875 Hyperkalemia: Secondary | ICD-10-CM | POA: Diagnosis not present

## 2018-05-15 DIAGNOSIS — I10 Essential (primary) hypertension: Secondary | ICD-10-CM | POA: Diagnosis not present

## 2018-05-15 DIAGNOSIS — Z171 Estrogen receptor negative status [ER-]: Secondary | ICD-10-CM | POA: Diagnosis not present

## 2018-05-15 DIAGNOSIS — T451X5A Adverse effect of antineoplastic and immunosuppressive drugs, initial encounter: Secondary | ICD-10-CM | POA: Diagnosis not present

## 2018-05-15 DIAGNOSIS — C50412 Malignant neoplasm of upper-outer quadrant of left female breast: Secondary | ICD-10-CM | POA: Diagnosis not present

## 2018-05-15 DIAGNOSIS — D701 Agranulocytosis secondary to cancer chemotherapy: Secondary | ICD-10-CM | POA: Diagnosis not present

## 2018-05-21 DIAGNOSIS — R112 Nausea with vomiting, unspecified: Secondary | ICD-10-CM | POA: Diagnosis not present

## 2018-05-21 DIAGNOSIS — S60212A Contusion of left wrist, initial encounter: Secondary | ICD-10-CM | POA: Diagnosis not present

## 2018-05-21 DIAGNOSIS — S20212A Contusion of left front wall of thorax, initial encounter: Secondary | ICD-10-CM | POA: Diagnosis not present

## 2018-05-21 DIAGNOSIS — S299XXA Unspecified injury of thorax, initial encounter: Secondary | ICD-10-CM | POA: Diagnosis not present

## 2018-05-21 DIAGNOSIS — S40021A Contusion of right upper arm, initial encounter: Secondary | ICD-10-CM | POA: Diagnosis not present

## 2018-05-21 DIAGNOSIS — S61511A Laceration without foreign body of right wrist, initial encounter: Secondary | ICD-10-CM | POA: Diagnosis not present

## 2018-05-21 DIAGNOSIS — M25531 Pain in right wrist: Secondary | ICD-10-CM | POA: Diagnosis not present

## 2018-05-21 DIAGNOSIS — Z7901 Long term (current) use of anticoagulants: Secondary | ICD-10-CM | POA: Diagnosis not present

## 2018-05-21 DIAGNOSIS — R0789 Other chest pain: Secondary | ICD-10-CM | POA: Diagnosis not present

## 2018-05-21 DIAGNOSIS — S61512A Laceration without foreign body of left wrist, initial encounter: Secondary | ICD-10-CM | POA: Diagnosis not present

## 2018-05-21 DIAGNOSIS — R51 Headache: Secondary | ICD-10-CM | POA: Diagnosis not present

## 2018-05-21 DIAGNOSIS — S60211A Contusion of right wrist, initial encounter: Secondary | ICD-10-CM | POA: Diagnosis not present

## 2018-05-21 DIAGNOSIS — I10 Essential (primary) hypertension: Secondary | ICD-10-CM | POA: Diagnosis not present

## 2018-05-21 DIAGNOSIS — M25532 Pain in left wrist: Secondary | ICD-10-CM | POA: Diagnosis not present

## 2018-05-21 DIAGNOSIS — E785 Hyperlipidemia, unspecified: Secondary | ICD-10-CM | POA: Diagnosis not present

## 2018-05-21 DIAGNOSIS — Z853 Personal history of malignant neoplasm of breast: Secondary | ICD-10-CM | POA: Diagnosis not present

## 2018-05-21 DIAGNOSIS — M25511 Pain in right shoulder: Secondary | ICD-10-CM | POA: Diagnosis not present

## 2018-05-21 DIAGNOSIS — Z7982 Long term (current) use of aspirin: Secondary | ICD-10-CM | POA: Diagnosis not present

## 2018-05-21 DIAGNOSIS — G8911 Acute pain due to trauma: Secondary | ICD-10-CM | POA: Diagnosis not present

## 2018-05-21 DIAGNOSIS — S40022A Contusion of left upper arm, initial encounter: Secondary | ICD-10-CM | POA: Diagnosis not present

## 2018-05-22 DIAGNOSIS — Z171 Estrogen receptor negative status [ER-]: Secondary | ICD-10-CM | POA: Diagnosis not present

## 2018-05-22 DIAGNOSIS — E875 Hyperkalemia: Secondary | ICD-10-CM | POA: Diagnosis not present

## 2018-05-22 DIAGNOSIS — D701 Agranulocytosis secondary to cancer chemotherapy: Secondary | ICD-10-CM | POA: Diagnosis not present

## 2018-05-22 DIAGNOSIS — I10 Essential (primary) hypertension: Secondary | ICD-10-CM | POA: Diagnosis not present

## 2018-05-22 DIAGNOSIS — C50412 Malignant neoplasm of upper-outer quadrant of left female breast: Secondary | ICD-10-CM | POA: Diagnosis not present

## 2018-05-22 DIAGNOSIS — Z5111 Encounter for antineoplastic chemotherapy: Secondary | ICD-10-CM | POA: Diagnosis not present

## 2018-05-22 DIAGNOSIS — E876 Hypokalemia: Secondary | ICD-10-CM | POA: Diagnosis not present

## 2018-05-22 DIAGNOSIS — T451X5A Adverse effect of antineoplastic and immunosuppressive drugs, initial encounter: Secondary | ICD-10-CM | POA: Diagnosis not present

## 2018-05-29 DIAGNOSIS — T451X5A Adverse effect of antineoplastic and immunosuppressive drugs, initial encounter: Secondary | ICD-10-CM | POA: Diagnosis not present

## 2018-05-29 DIAGNOSIS — Z5111 Encounter for antineoplastic chemotherapy: Secondary | ICD-10-CM | POA: Diagnosis not present

## 2018-05-29 DIAGNOSIS — D701 Agranulocytosis secondary to cancer chemotherapy: Secondary | ICD-10-CM | POA: Diagnosis not present

## 2018-05-29 DIAGNOSIS — Z171 Estrogen receptor negative status [ER-]: Secondary | ICD-10-CM | POA: Diagnosis not present

## 2018-05-29 DIAGNOSIS — E875 Hyperkalemia: Secondary | ICD-10-CM | POA: Diagnosis not present

## 2018-05-29 DIAGNOSIS — C50412 Malignant neoplasm of upper-outer quadrant of left female breast: Secondary | ICD-10-CM | POA: Diagnosis not present

## 2018-05-29 DIAGNOSIS — I10 Essential (primary) hypertension: Secondary | ICD-10-CM | POA: Diagnosis not present

## 2018-05-30 ENCOUNTER — Other Ambulatory Visit: Payer: Self-pay | Admitting: Physician Assistant

## 2018-06-05 DIAGNOSIS — T451X5A Adverse effect of antineoplastic and immunosuppressive drugs, initial encounter: Secondary | ICD-10-CM | POA: Diagnosis not present

## 2018-06-05 DIAGNOSIS — Z171 Estrogen receptor negative status [ER-]: Secondary | ICD-10-CM | POA: Diagnosis not present

## 2018-06-05 DIAGNOSIS — G620 Drug-induced polyneuropathy: Secondary | ICD-10-CM | POA: Diagnosis not present

## 2018-06-05 DIAGNOSIS — Z9221 Personal history of antineoplastic chemotherapy: Secondary | ICD-10-CM | POA: Diagnosis not present

## 2018-06-05 DIAGNOSIS — I10 Essential (primary) hypertension: Secondary | ICD-10-CM | POA: Diagnosis not present

## 2018-06-05 DIAGNOSIS — Z006 Encounter for examination for normal comparison and control in clinical research program: Secondary | ICD-10-CM | POA: Diagnosis not present

## 2018-06-05 DIAGNOSIS — C50412 Malignant neoplasm of upper-outer quadrant of left female breast: Secondary | ICD-10-CM | POA: Diagnosis not present

## 2018-06-05 DIAGNOSIS — D701 Agranulocytosis secondary to cancer chemotherapy: Secondary | ICD-10-CM | POA: Diagnosis not present

## 2018-06-12 DIAGNOSIS — I10 Essential (primary) hypertension: Secondary | ICD-10-CM | POA: Diagnosis not present

## 2018-06-12 DIAGNOSIS — Z5111 Encounter for antineoplastic chemotherapy: Secondary | ICD-10-CM | POA: Diagnosis not present

## 2018-06-12 DIAGNOSIS — Z171 Estrogen receptor negative status [ER-]: Secondary | ICD-10-CM | POA: Diagnosis not present

## 2018-06-12 DIAGNOSIS — Z006 Encounter for examination for normal comparison and control in clinical research program: Secondary | ICD-10-CM | POA: Diagnosis not present

## 2018-06-12 DIAGNOSIS — T451X5A Adverse effect of antineoplastic and immunosuppressive drugs, initial encounter: Secondary | ICD-10-CM | POA: Diagnosis not present

## 2018-06-12 DIAGNOSIS — G620 Drug-induced polyneuropathy: Secondary | ICD-10-CM | POA: Diagnosis not present

## 2018-06-12 DIAGNOSIS — D701 Agranulocytosis secondary to cancer chemotherapy: Secondary | ICD-10-CM | POA: Diagnosis not present

## 2018-06-12 DIAGNOSIS — C50412 Malignant neoplasm of upper-outer quadrant of left female breast: Secondary | ICD-10-CM | POA: Diagnosis not present

## 2018-06-12 DIAGNOSIS — Z9221 Personal history of antineoplastic chemotherapy: Secondary | ICD-10-CM | POA: Diagnosis not present

## 2018-06-18 DIAGNOSIS — Z171 Estrogen receptor negative status [ER-]: Secondary | ICD-10-CM | POA: Diagnosis not present

## 2018-06-18 DIAGNOSIS — C50412 Malignant neoplasm of upper-outer quadrant of left female breast: Secondary | ICD-10-CM | POA: Diagnosis not present

## 2018-06-18 DIAGNOSIS — I10 Essential (primary) hypertension: Secondary | ICD-10-CM | POA: Diagnosis not present

## 2018-06-25 DIAGNOSIS — Z885 Allergy status to narcotic agent status: Secondary | ICD-10-CM | POA: Diagnosis not present

## 2018-06-25 DIAGNOSIS — Z171 Estrogen receptor negative status [ER-]: Secondary | ICD-10-CM | POA: Diagnosis not present

## 2018-06-25 DIAGNOSIS — I10 Essential (primary) hypertension: Secondary | ICD-10-CM | POA: Diagnosis not present

## 2018-06-25 DIAGNOSIS — E785 Hyperlipidemia, unspecified: Secondary | ICD-10-CM | POA: Diagnosis not present

## 2018-06-25 DIAGNOSIS — Z9221 Personal history of antineoplastic chemotherapy: Secondary | ICD-10-CM | POA: Diagnosis not present

## 2018-06-25 DIAGNOSIS — M1990 Unspecified osteoarthritis, unspecified site: Secondary | ICD-10-CM | POA: Diagnosis not present

## 2018-06-25 DIAGNOSIS — Z808 Family history of malignant neoplasm of other organs or systems: Secondary | ICD-10-CM | POA: Diagnosis not present

## 2018-06-25 DIAGNOSIS — K219 Gastro-esophageal reflux disease without esophagitis: Secondary | ICD-10-CM | POA: Diagnosis not present

## 2018-06-25 DIAGNOSIS — Z800 Family history of malignant neoplasm of digestive organs: Secondary | ICD-10-CM | POA: Diagnosis not present

## 2018-06-25 DIAGNOSIS — N6012 Diffuse cystic mastopathy of left breast: Secondary | ICD-10-CM | POA: Diagnosis not present

## 2018-06-25 DIAGNOSIS — Z803 Family history of malignant neoplasm of breast: Secondary | ICD-10-CM | POA: Diagnosis not present

## 2018-06-25 DIAGNOSIS — Z95828 Presence of other vascular implants and grafts: Secondary | ICD-10-CM | POA: Diagnosis not present

## 2018-06-25 DIAGNOSIS — D4862 Neoplasm of uncertain behavior of left breast: Secondary | ICD-10-CM | POA: Diagnosis not present

## 2018-06-25 DIAGNOSIS — Z79899 Other long term (current) drug therapy: Secondary | ICD-10-CM | POA: Diagnosis not present

## 2018-06-25 DIAGNOSIS — D702 Other drug-induced agranulocytosis: Secondary | ICD-10-CM | POA: Diagnosis not present

## 2018-06-25 DIAGNOSIS — T451X5A Adverse effect of antineoplastic and immunosuppressive drugs, initial encounter: Secondary | ICD-10-CM | POA: Diagnosis not present

## 2018-06-25 DIAGNOSIS — Z880 Allergy status to penicillin: Secondary | ICD-10-CM | POA: Diagnosis not present

## 2018-06-25 DIAGNOSIS — G620 Drug-induced polyneuropathy: Secondary | ICD-10-CM | POA: Diagnosis not present

## 2018-06-25 DIAGNOSIS — Z7982 Long term (current) use of aspirin: Secondary | ICD-10-CM | POA: Diagnosis not present

## 2018-06-25 DIAGNOSIS — C50412 Malignant neoplasm of upper-outer quadrant of left female breast: Secondary | ICD-10-CM | POA: Diagnosis not present

## 2018-06-29 DIAGNOSIS — I10 Essential (primary) hypertension: Secondary | ICD-10-CM | POA: Diagnosis not present

## 2018-06-29 DIAGNOSIS — Z880 Allergy status to penicillin: Secondary | ICD-10-CM | POA: Diagnosis not present

## 2018-06-29 DIAGNOSIS — G8918 Other acute postprocedural pain: Secondary | ICD-10-CM | POA: Diagnosis not present

## 2018-06-29 DIAGNOSIS — Z79899 Other long term (current) drug therapy: Secondary | ICD-10-CM | POA: Diagnosis not present

## 2018-06-29 DIAGNOSIS — Z9221 Personal history of antineoplastic chemotherapy: Secondary | ICD-10-CM | POA: Diagnosis not present

## 2018-06-29 DIAGNOSIS — Z7951 Long term (current) use of inhaled steroids: Secondary | ICD-10-CM | POA: Diagnosis not present

## 2018-06-29 DIAGNOSIS — E041 Nontoxic single thyroid nodule: Secondary | ICD-10-CM | POA: Diagnosis not present

## 2018-06-29 DIAGNOSIS — M96842 Postprocedural seroma of a musculoskeletal structure following a musculoskeletal system procedure: Secondary | ICD-10-CM | POA: Diagnosis not present

## 2018-06-29 DIAGNOSIS — R238 Other skin changes: Secondary | ICD-10-CM | POA: Diagnosis not present

## 2018-06-29 DIAGNOSIS — C50919 Malignant neoplasm of unspecified site of unspecified female breast: Secondary | ICD-10-CM | POA: Diagnosis not present

## 2018-06-29 DIAGNOSIS — K5730 Diverticulosis of large intestine without perforation or abscess without bleeding: Secondary | ICD-10-CM | POA: Diagnosis not present

## 2018-06-29 DIAGNOSIS — Z885 Allergy status to narcotic agent status: Secondary | ICD-10-CM | POA: Diagnosis not present

## 2018-06-29 DIAGNOSIS — E785 Hyperlipidemia, unspecified: Secondary | ICD-10-CM | POA: Diagnosis not present

## 2018-06-29 DIAGNOSIS — R2241 Localized swelling, mass and lump, right lower limb: Secondary | ICD-10-CM | POA: Diagnosis not present

## 2018-07-09 DIAGNOSIS — I10 Essential (primary) hypertension: Secondary | ICD-10-CM | POA: Diagnosis not present

## 2018-07-09 DIAGNOSIS — T451X5A Adverse effect of antineoplastic and immunosuppressive drugs, initial encounter: Secondary | ICD-10-CM | POA: Diagnosis not present

## 2018-07-09 DIAGNOSIS — G620 Drug-induced polyneuropathy: Secondary | ICD-10-CM | POA: Diagnosis not present

## 2018-07-09 DIAGNOSIS — Z171 Estrogen receptor negative status [ER-]: Secondary | ICD-10-CM | POA: Diagnosis not present

## 2018-07-09 DIAGNOSIS — C50412 Malignant neoplasm of upper-outer quadrant of left female breast: Secondary | ICD-10-CM | POA: Diagnosis not present

## 2018-07-09 DIAGNOSIS — Z87898 Personal history of other specified conditions: Secondary | ICD-10-CM | POA: Diagnosis not present

## 2018-07-15 DIAGNOSIS — Z9221 Personal history of antineoplastic chemotherapy: Secondary | ICD-10-CM | POA: Diagnosis not present

## 2018-07-15 DIAGNOSIS — Z9889 Other specified postprocedural states: Secondary | ICD-10-CM | POA: Diagnosis not present

## 2018-07-15 DIAGNOSIS — C50412 Malignant neoplasm of upper-outer quadrant of left female breast: Secondary | ICD-10-CM | POA: Diagnosis not present

## 2018-07-15 DIAGNOSIS — L7633 Postprocedural seroma of skin and subcutaneous tissue following a dermatologic procedure: Secondary | ICD-10-CM | POA: Diagnosis not present

## 2018-07-15 DIAGNOSIS — Z171 Estrogen receptor negative status [ER-]: Secondary | ICD-10-CM | POA: Diagnosis not present

## 2018-07-16 DIAGNOSIS — C50412 Malignant neoplasm of upper-outer quadrant of left female breast: Secondary | ICD-10-CM | POA: Diagnosis not present

## 2018-07-18 DIAGNOSIS — C50412 Malignant neoplasm of upper-outer quadrant of left female breast: Secondary | ICD-10-CM | POA: Diagnosis not present

## 2018-07-18 DIAGNOSIS — Z510 Encounter for antineoplastic radiation therapy: Secondary | ICD-10-CM | POA: Diagnosis not present

## 2018-07-24 DIAGNOSIS — M25512 Pain in left shoulder: Secondary | ICD-10-CM | POA: Diagnosis not present

## 2018-07-24 DIAGNOSIS — M25511 Pain in right shoulder: Secondary | ICD-10-CM | POA: Diagnosis not present

## 2018-07-24 DIAGNOSIS — G8929 Other chronic pain: Secondary | ICD-10-CM | POA: Diagnosis not present

## 2018-07-30 ENCOUNTER — Other Ambulatory Visit: Payer: Self-pay | Admitting: Physician Assistant

## 2018-07-30 DIAGNOSIS — M1712 Unilateral primary osteoarthritis, left knee: Secondary | ICD-10-CM

## 2018-07-30 DIAGNOSIS — M1711 Unilateral primary osteoarthritis, right knee: Secondary | ICD-10-CM

## 2018-08-04 DIAGNOSIS — C50412 Malignant neoplasm of upper-outer quadrant of left female breast: Secondary | ICD-10-CM | POA: Diagnosis not present

## 2018-08-04 DIAGNOSIS — Z510 Encounter for antineoplastic radiation therapy: Secondary | ICD-10-CM | POA: Diagnosis not present

## 2018-08-05 DIAGNOSIS — C50412 Malignant neoplasm of upper-outer quadrant of left female breast: Secondary | ICD-10-CM | POA: Diagnosis not present

## 2018-08-05 DIAGNOSIS — Z510 Encounter for antineoplastic radiation therapy: Secondary | ICD-10-CM | POA: Diagnosis not present

## 2018-08-06 ENCOUNTER — Telehealth: Payer: Self-pay | Admitting: Physician Assistant

## 2018-08-06 ENCOUNTER — Ambulatory Visit: Payer: Medicare Other | Admitting: Physician Assistant

## 2018-08-06 DIAGNOSIS — C50412 Malignant neoplasm of upper-outer quadrant of left female breast: Secondary | ICD-10-CM | POA: Diagnosis not present

## 2018-08-06 DIAGNOSIS — Z510 Encounter for antineoplastic radiation therapy: Secondary | ICD-10-CM | POA: Diagnosis not present

## 2018-08-07 DIAGNOSIS — Z510 Encounter for antineoplastic radiation therapy: Secondary | ICD-10-CM | POA: Diagnosis not present

## 2018-08-07 DIAGNOSIS — C50412 Malignant neoplasm of upper-outer quadrant of left female breast: Secondary | ICD-10-CM | POA: Diagnosis not present

## 2018-08-08 DIAGNOSIS — Z510 Encounter for antineoplastic radiation therapy: Secondary | ICD-10-CM | POA: Diagnosis not present

## 2018-08-08 DIAGNOSIS — C50412 Malignant neoplasm of upper-outer quadrant of left female breast: Secondary | ICD-10-CM | POA: Diagnosis not present

## 2018-08-11 DIAGNOSIS — C50412 Malignant neoplasm of upper-outer quadrant of left female breast: Secondary | ICD-10-CM | POA: Diagnosis not present

## 2018-08-11 DIAGNOSIS — Z510 Encounter for antineoplastic radiation therapy: Secondary | ICD-10-CM | POA: Diagnosis not present

## 2018-08-12 ENCOUNTER — Ambulatory Visit (INDEPENDENT_AMBULATORY_CARE_PROVIDER_SITE_OTHER): Payer: Medicare Other | Admitting: Physician Assistant

## 2018-08-12 ENCOUNTER — Other Ambulatory Visit: Payer: Self-pay

## 2018-08-12 ENCOUNTER — Encounter: Payer: Self-pay | Admitting: *Deleted

## 2018-08-12 ENCOUNTER — Ambulatory Visit (INDEPENDENT_AMBULATORY_CARE_PROVIDER_SITE_OTHER): Payer: Medicare Other | Admitting: *Deleted

## 2018-08-12 DIAGNOSIS — F419 Anxiety disorder, unspecified: Secondary | ICD-10-CM

## 2018-08-12 DIAGNOSIS — Z0000 Encounter for general adult medical examination without abnormal findings: Secondary | ICD-10-CM

## 2018-08-12 DIAGNOSIS — C50919 Malignant neoplasm of unspecified site of unspecified female breast: Secondary | ICD-10-CM

## 2018-08-12 DIAGNOSIS — M1712 Unilateral primary osteoarthritis, left knee: Secondary | ICD-10-CM

## 2018-08-12 DIAGNOSIS — Z510 Encounter for antineoplastic radiation therapy: Secondary | ICD-10-CM | POA: Diagnosis not present

## 2018-08-12 DIAGNOSIS — M1711 Unilateral primary osteoarthritis, right knee: Secondary | ICD-10-CM | POA: Diagnosis not present

## 2018-08-12 DIAGNOSIS — C50412 Malignant neoplasm of upper-outer quadrant of left female breast: Secondary | ICD-10-CM | POA: Diagnosis not present

## 2018-08-12 MED ORDER — HYDROCODONE-ACETAMINOPHEN 7.5-325 MG PO TABS: 1.0000 | ORAL_TABLET | Freq: Two times a day (BID) | ORAL | 0 refills | Status: DC | PRN

## 2018-08-12 MED ORDER — ALPRAZOLAM 0.5 MG PO TABS: 0.5000 mg | ORAL_TABLET | Freq: Two times a day (BID) | ORAL | 5 refills | Status: DC | PRN

## 2018-08-12 NOTE — Progress Notes (Signed)
Telephone visit  Subjective: QV:ZDGLOV meds PCP: Terald Sleeper, PA-C FIE:PPIRJJO Sudbeck is a 78 y.o. female calls for telephone consult today. Patient provides verbal consent for consult held via phone.  Patient is identified with 2 separate identifiers.  At this time the entire area is on COVID-19 social distancing and stay home orders are in place.  Patient is of higher risk and therefore we are performing this by a virtual method.  Location of patient: home Location of provider: [facility] Others present for call: no  Patient has known chronic pain related to osteoarthritis of many joints, she is currently dealing with breast cancer.  Some of the chemotherapy has bothered her and give her a lot of neuropathy and pain.  She is also some mild anxiety.  She does not take the medication much at all but would like to have a refill.  Cannot come in at this time due to her low immunity.  She is currently under radiation treatment.  We will obtain drug screen and contract at next visit  PAIN ASSESSMENT: Cause of pain-osteoarthritis left knee, right knee, multiple joint pain, neuropathy from breast cancer  This patient returns for a 3 month recheck on narcotic use for the above named conditions  Current medications-Hydrocodone 7.5/325 1 4 times daily as needed for severe pain Ibuprofen 800 mg Medication side effects- no Any concerns- no  Pain on scale of 1-10- 8 Frequency- daily What increases pain- walking What makes pain Better- rest Effects on ADL - mild Any change in general medical condition- breast cancer  Effectiveness of current meds- good Adverse reactions form pain meds-no PMP AWARE website reviewed: Yes Any suspicious activity on PMP Aware: No MME daily dose: 10, irregular LME daily dose: 2, on irregular basis  Contract on file will obtain Last UDS  none  History of overdose or risk of abuse no    ROS: Per HPI  Allergies  Allergen Reactions  .  Acetaminophen Other (See Comments)    Tylenol with codeine only - GI UPSET  . Amoxicillin Rash  . Ampicillin Rash  . Codeine Phosphate Nausea Only   Past Medical History:  Diagnosis Date  . Anxiety   . Degeneration of cervical intervertebral disc   . Generalized osteoarthrosis, involving multiple sites   . Radiculopathy, cervical   . Rosacea keratitis   . Varicose veins     Current Outpatient Medications:  .  albuterol (PROVENTIL HFA;VENTOLIN HFA) 108 (90 Base) MCG/ACT inhaler, Inhale 2 puffs into the lungs every 6 (six) hours as needed for wheezing or shortness of breath., Disp: 1 Inhaler, Rfl: 5 .  ALPRAZolam (XANAX) 0.5 MG tablet, Take 1 tablet (0.5 mg total) by mouth 2 (two) times daily as needed for anxiety., Disp: 60 tablet, Rfl: 5 .  aspirin EC 81 MG tablet, Take 81 mg by mouth daily., Disp: , Rfl:  .  budesonide-formoterol (SYMBICORT) 160-4.5 MCG/ACT inhaler, Inhale 2 puffs into the lungs 2 (two) times daily., Disp: 1 Inhaler, Rfl: 2 .  citalopram (CELEXA) 40 MG tablet, Take 1 tablet (40 mg total) by mouth daily., Disp: 30 tablet, Rfl: 0 .  donepezil (ARICEPT) 10 MG tablet, Take 1 tablet (10 mg total) by mouth at bedtime., Disp: 30 tablet, Rfl: 11 .  famotidine (PEPCID) 10 MG tablet, Take 10 mg by mouth 2 (two) times daily., Disp: , Rfl:  .  furosemide (LASIX) 20 MG tablet, Take 20 mg by mouth as needed., Disp: , Rfl:  .  HYDROcodone-acetaminophen (NORCO) 5-325 MG tablet, Take 1 tablet by mouth every 6 (six) hours as needed for moderate pain., Disp: 60 tablet, Rfl: 0 .  HYDROcodone-acetaminophen (NORCO) 5-325 MG tablet, Take 1 tablet by mouth every 6 (six) hours as needed for moderate pain., Disp: 60 tablet, Rfl: 0 .  HYDROcodone-acetaminophen (NORCO) 7.5-325 MG tablet, Take 1 tablet by mouth every 12 (twelve) hours as needed for moderate pain (bid for pain)., Disp: 60 tablet, Rfl: 0 .  HYDROcodone-acetaminophen (NORCO) 7.5-325 MG tablet, Take 1 tablet by mouth every 12 (twelve)  hours as needed for moderate pain., Disp: 60 tablet, Rfl: 0 .  HYDROcodone-acetaminophen (NORCO) 7.5-325 MG tablet, Take 1 tablet by mouth every 12 (twelve) hours as needed for moderate pain., Disp: 60 tablet, Rfl: 0 .  ibuprofen (ADVIL,MOTRIN) 800 MG tablet, TAKE  (1)  TABLET  THREE TIMES DAILY., Disp: 90 tablet, Rfl: 1 .  omeprazole (PRILOSEC) 20 MG capsule, TAKE (1) CAPSULE DAILY, Disp: 30 capsule, Rfl: 3 .  pravastatin (PRAVACHOL) 20 MG tablet, Take 20 mg by mouth daily., Disp: , Rfl:  .  traZODone (DESYREL) 100 MG tablet, Take 2-3 tablets (200-300 mg total) by mouth at bedtime., Disp: 270 tablet, Rfl: 3 .  valACYclovir (VALTREX) 1000 MG tablet, Take 1 tablet (1,000 mg total) by mouth 2 (two) times daily., Disp: 20 tablet, Rfl: 0  Assessment/ Plan: 78 y.o. female   1. Primary osteoarthritis of right knee - HYDROcodone-acetaminophen (NORCO) 7.5-325 MG tablet; Take 1 tablet by mouth every 12 (twelve) hours as needed for moderate pain (bid for pain).  Dispense: 60 tablet; Refill: 0 - HYDROcodone-acetaminophen (NORCO) 7.5-325 MG tablet; Take 1 tablet by mouth every 12 (twelve) hours as needed for moderate pain.  Dispense: 60 tablet; Refill: 0 - HYDROcodone-acetaminophen (NORCO) 7.5-325 MG tablet; Take 1 tablet by mouth every 12 (twelve) hours as needed for moderate pain.  Dispense: 60 tablet; Refill: 0  2. Primary osteoarthritis of left knee - HYDROcodone-acetaminophen (NORCO) 7.5-325 MG tablet; Take 1 tablet by mouth every 12 (twelve) hours as needed for moderate pain (bid for pain).  Dispense: 60 tablet; Refill: 0 - HYDROcodone-acetaminophen (NORCO) 7.5-325 MG tablet; Take 1 tablet by mouth every 12 (twelve) hours as needed for moderate pain.  Dispense: 60 tablet; Refill: 0 - HYDROcodone-acetaminophen (NORCO) 7.5-325 MG tablet; Take 1 tablet by mouth every 12 (twelve) hours as needed for moderate pain.  Dispense: 60 tablet; Refill: 0  3. Malignant neoplasm of female breast, unspecified  estrogen receptor status, unspecified laterality, unspecified site of breast ([place])  4. Anxiety - ALPRAZolam (XANAX) 0.5 MG tablet; Take 1 tablet (0.5 mg total) by mouth 2 (two) times daily as needed for anxiety.  Dispense: 60 tablet; Refill: 5  Start time: 4:48 PM End time: 4:59 PM  Meds ordered this encounter  Medications  . ALPRAZolam (XANAX) 0.5 MG tablet    Sig: Take 1 tablet (0.5 mg total) by mouth 2 (two) times daily as needed for anxiety.    Dispense:  60 tablet    Refill:  5    Order Specific Question:   Supervising Provider    Answer:   Janora Norlander [[id number]]  . HYDROcodone-acetaminophen (NORCO) 7.5-325 MG tablet    Sig: Take 1 tablet by mouth every 12 (twelve) hours as needed for moderate pain (bid for pain).    Dispense:  60 tablet    Refill:  0    Fill 60 days from original script date    Order Specific Question:  Supervising Provider    Answer:   Janora Norlander [[id number]]  . HYDROcodone-acetaminophen (NORCO) 7.5-325 MG tablet    Sig: Take 1 tablet by mouth every 12 (twelve) hours as needed for moderate pain.    Dispense:  60 tablet    Refill:  0    Fill 30 days from original script date    Order Specific Question:   Supervising Provider    Answer:   Janora Norlander [[id number]]  . HYDROcodone-acetaminophen (NORCO) 7.5-325 MG tablet    Sig: Take 1 tablet by mouth every 12 (twelve) hours as needed for moderate pain.    Dispense:  60 tablet    Refill:  0    Order Specific Question:   Supervising Provider    Answer:   Janora Norlander [[id number]]    Particia Nearing PA-C [place] [phone]

## 2018-08-12 NOTE — Patient Instructions (Addendum)
  [name] , Thank you for taking time to talk with me for your Medicare Wellness Visit. I appreciate your ongoing commitment to your health goals. Please review the following plan we discussed and let me know if I can assist you in the future.   These are the goals we discussed: Goals             . Have 3 meals a day     Increase fruits and vegetables Increase water intake         This is a list of the screening recommended for you and due dates:  Health Maintenance  Topic Date Due  . Tetanus Vaccine  04/19/1959  . Flu Shot  10/04/2018  . Mammogram  12/29/2018  . DEXA scan (bone density measurement)  Completed  . Pneumonia vaccines  Completed

## 2018-08-12 NOTE — Progress Notes (Signed)
MEDICARE ANNUAL WELLNESS VISIT  08/12/2018  Telephone Visit Disclaimer This Medicare AWV was conducted by telephone due to national recommendations for restrictions regarding the COVID-19 Pandemic (e.g. social distancing).  I verified, using two identifiers, that I am speaking with [name] or their authorized healthcare agent. I discussed the limitations, risks, security, and privacy concerns of performing an evaluation and management service by telephone and the potential availability of an in-person appointment in the future. The patient expressed understanding and agreed to proceed.   Subjective:  [name] is a 78 y.o. female patient of Terald Sleeper, PA-C who had a Medicare Annual Wellness Visit today via telephone. 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 She has 6 children, 1 son is deceased, 50 grandchildren, and 85 great grandchildren. She reports that she is socially active and does interact with friends/family daily.  She is minimally physically active and enjoys playing cards and spending time with family.  She is active in her church.  [name] is currently receiving daily radiation treatments for breast cancer.  She states that she and her husband will be moving to [facility] in the next couple of months to live with their son so that he can help take care of her husband who has Parkinson's.  Patient Care Team: Theodoro Clock as PCP - General (Physician Assistant) [name], MD as Consulting Physician (Orthopedic Surgery) Wilford Corner, MD as Consulting Physician (Gastroenterology)  Advanced Directives 08/12/2018 03/06/2017 07/24/2016 11/14/2015 10/07/2015  Does Patient Have a Medical Advance Directive? Yes Yes Yes Yes Yes  Type of Paramedic of [place];Living will - Living will;Healthcare Power of [place];Out of facility DNR (pink MOST or yellow form) - Living will;Healthcare Power of  Attorney  Does patient want to make changes to medical advance directive? No - Patient declined - No - Patient declined - No - Patient declined  Copy of [place] in Chart? No - copy requested - No - copy requested - No - copy requested    Hospital Utilization Over the Past 12 Months: # of hospitalizations or ER visits: 2 ER visits- for a fall and post operative pain # of surgeries: 1- Left breast cancer surgery  Review of Systems    Patient reports that her overall health is worse compared to last year due to breast cancer and treatments.    Review of Systems:  Musculoskeletal - generalized joint pain  All other systems negative.  Pain Assessment Pain Score: 8      Current Medications & Allergies (verified) Allergies as of 08/12/2018      Reactions   Acetaminophen Other (See Comments)   Tylenol with codeine only - GI UPSET   Amoxicillin Rash   Ampicillin Rash   Codeine Phosphate Nausea Only      Medication List       Accurate as of August 12, 2018  5:18 PM. If you have any questions, ask your nurse or doctor.        STOP taking these medications   azithromycin 250 MG tablet Commonly known as:  Zithromax Z-Pak Stopped by:  Terald Sleeper, PA-C   HYDROcodone-acetaminophen 5-325 MG tablet Commonly known as:  NORCO/VICODIN Replaced by:  HYDROcodone-acetaminophen 7.5-325 MG tablet You also have another medication with the same name that you need to continue taking as instructed. Stopped by:  Terald Sleeper, PA-C     TAKE these medications   albuterol 108 (  90 Base) MCG/ACT inhaler Commonly known as:  VENTOLIN HFA Inhale 2 puffs into the lungs every 6 (six) hours as needed for wheezing or shortness of breath.   ALPRAZolam 0.5 MG tablet Commonly known as:  Xanax Take 1 tablet (0.5 mg total) by mouth 2 (two) times daily as needed for anxiety.   aspirin EC 81 MG tablet Take 81 mg by mouth daily.   budesonide-formoterol 160-4.5 MCG/ACT inhaler  Commonly known as:  SYMBICORT Inhale 2 puffs into the lungs 2 (two) times daily.   citalopram 40 MG tablet Commonly known as:  CELEXA Take 1 tablet (40 mg total) by mouth daily.   donepezil 10 MG tablet Commonly known as:  ARICEPT Take 1 tablet (10 mg total) by mouth at bedtime.   famotidine 10 MG tablet Commonly known as:  PEPCID Take 10 mg by mouth 2 (two) times daily.   furosemide 20 MG tablet Commonly known as:  LASIX Take 20 mg by mouth as needed.   HYDROcodone-acetaminophen 5-325 MG tablet Commonly known as:  Norco Take 1 tablet by mouth every 6 (six) hours as needed for moderate pain. What changed:    Another medication with the same name was added. Make sure you understand how and when to take each.  Another medication with the same name was removed. Continue taking this medication, and follow the directions you see here. Changed by:  Terald Sleeper, PA-C   HYDROcodone-acetaminophen 5-325 MG tablet Commonly known as:  Norco Take 1 tablet by mouth every 6 (six) hours as needed for moderate pain. What changed:    Another medication with the same name was added. Make sure you understand how and when to take each.  Another medication with the same name was removed. Continue taking this medication, and follow the directions you see here. Changed by:  Terald Sleeper, PA-C   HYDROcodone-acetaminophen 7.5-325 MG tablet Commonly known as:  NORCO Take 1 tablet by mouth every 12 (twelve) hours as needed for moderate pain (bid for pain). What changed:  You were already taking a medication with the same name, and this prescription was added. Make sure you understand how and when to take each. Replaces:  HYDROcodone-acetaminophen 5-325 MG tablet Changed by:  Terald Sleeper, PA-C   HYDROcodone-acetaminophen 7.5-325 MG tablet Commonly known as:  NORCO Take 1 tablet by mouth every 12 (twelve) hours as needed for moderate pain. What changed:  You were already taking a medication  with the same name, and this prescription was added. Make sure you understand how and when to take each. Changed by:  Terald Sleeper, PA-C   HYDROcodone-acetaminophen 7.5-325 MG tablet Commonly known as:  NORCO Take 1 tablet by mouth every 12 (twelve) hours as needed for moderate pain. What changed:  You were already taking a medication with the same name, and this prescription was added. Make sure you understand how and when to take each. Changed by:  Terald Sleeper, PA-C   ibuprofen 800 MG tablet Commonly known as:  ADVIL TAKE  (1)  TABLET  THREE TIMES DAILY.   omeprazole 20 MG capsule Commonly known as:  PRILOSEC TAKE (1) CAPSULE DAILY   pravastatin 20 MG tablet Commonly known as:  PRAVACHOL Take 20 mg by mouth daily.   traZODone 100 MG tablet Commonly known as:  DESYREL Take 2-3 tablets (200-300 mg total) by mouth at bedtime.   valACYclovir 1000 MG tablet Commonly known as:  VALTREX Take 1 tablet (1,000 mg total) by  mouth 2 (two) times daily.       History (reviewed): Past Medical History:  Diagnosis Date  . Anxiety   . Degeneration of cervical intervertebral disc   . Generalized osteoarthrosis, involving multiple sites   . Radiculopathy, cervical   . Rosacea keratitis   . Varicose veins    Past Surgical History:  Procedure Laterality Date  . APPENDECTOMY    . BACK SURGERY  1997  . BREAST SURGERY     implants , removed later  . CHOLECYSTECTOMY    . KNEE ARTHROPLASTY Right    07  . SHOULDER ARTHROSCOPY DISTAL CLAVICLE EXCISION AND OPEN ROTATOR CUFF REPAIR Left   . TONSILLECTOMY    . TOTAL KNEE ARTHROPLASTY Left 10/17/2015   Procedure: TOTAL KNEE ARTHROPLASTY;  Surgeon: [name], MD;  Location: [facility];  Service: Orthopedics;  Laterality: Left;   Family History  Problem Relation Age of Onset  . Heart disease Mother   . Early death Mother   . Glaucoma Mother   . Heart disease Father   . Cancer Sister   . Glaucoma Brother   . Early death Sister   .  Glaucoma Brother   . COPD Sister   . Glaucoma Sister   . Seizures Son   . Alcohol abuse Son   . Drug abuse Son    Social History   Socioeconomic History  . Marital status: Married    Spouse name: Not on file  . Number of children: 6  . Years of education: 57  . Highest education level: Associate degree: occupational, Hotel manager, or vocational program  Occupational History  . Occupation: retired    Comment: Marine scientist   Social Needs  . Financial resource strain: Not hard at all  . Food insecurity:    Worry: Never true    Inability: Never true  . Transportation needs:    Medical: No    Non-medical: No  Tobacco Use  . Smoking status: Never Smoker  . Smokeless tobacco: Never Used  Substance and Sexual Activity  . Alcohol use: Yes    Comment: occ  . Drug use: No  . Sexual activity: Not Currently  Lifestyle  . Physical activity:    Days per week: 0 days    Minutes per session: 0 min  . Stress: Only a little  Relationships  . Social connections:    Talks on phone: More than three times a week    Gets together: More than three times a week    Attends religious service: More than 4 times per year    Active member of club or organization: Yes    Attends meetings of clubs or organizations: More than 4 times per year    Relationship status: Married  Other Topics Concern  . Not on file  Social History Narrative  . Not on file    Activities of Daily Living In your present state of health, do you have any difficulty performing the following activities: 08/12/2018  Hearing? Y  Comment Slight decrease in hearing, does not want hearing evaluation at this time  Vision? N  Difficulty concentrating or making decisions? Y  Walking or climbing stairs? N  Dressing or bathing? N  Doing errands, shopping? N  Preparing Food and eating ? N  Using the Toilet? N  In the past six months, have you accidently leaked urine? Y  Comment Urinary frequency  Do you have problems with loss of bowel  control? Y  Comment Due to chemotherapy  Managing your Medications? N  Managing your Finances? N  Housekeeping or managing your Housekeeping? N  Some recent data might be hidden        Exercise Current Exercise Habits: The patient does not participate in regular exercise at present, Exercise limited by: Other - see comments;orthopedic condition(s)(Cancer treatments)  Diet Patient reports consuming 2 meals a day and 1 snack(s) a day Patient reports that her primary diet is: Regular Patient reports that she does have regular access to food.   Depression Screen PHQ 2/9 Scores 08/12/2018 04/23/2018 09/27/2017 09/16/2017 07/03/2017 06/24/2017 05/03/2017  PHQ - 2 Score 1 2 2  0 0 0 0  PHQ- 9 Score - 6 6 - - - -     Fall Risk Fall Risk  08/12/2018 09/27/2017 09/16/2017 07/03/2017 06/24/2017  Falls in the past year? 1 No No No No  Follow up Falls prevention discussed - - - -     Objective:  [name] seemed alert and oriented and she participated appropriately during our telephone visit.  Blood Pressure Weight BMI  BP Readings from Last 3 Encounters:  04/23/18 129/76  02/04/18 131/73  02/03/18 120/70   Wt Readings from Last 3 Encounters:  04/23/18 203 lb (92.1 kg)  02/04/18 203 lb 12.8 oz (92.4 kg)  02/03/18 205 lb 9.6 oz (93.3 kg)   BMI Readings from Last 1 Encounters:  04/23/18 41.00 kg/m    *Unable to obtain current vital signs, weight, and BMI due to telephone visit type  Hearing/Vision  . [name] did not seem to have difficulty with hearing/understanding during the telephone conversation . Reports that she has had a formal eye exam by an eye care professional within the past year . Reports that she has not had a formal hearing evaluation within the past year *Unable to fully assess hearing and vision during telephone visit type  Cognitive Function: 6CIT Screen 08/12/2018  What Year? 0 points  What month? 0 points  What time? 0 points  Count back from 20 0 points  Months in  reverse 2 points  Repeat phrase 4 points  Total Score 6    Normal Cognitive Function Screening: Yes (Normal:0-7, Significant for Dysfunction: >8)  Immunization & Health Maintenance Record Immunization History  Administered Date(s) Administered  . Influenza, High Dose Seasonal PF 03/19/2017, 02/04/2018  . Pneumococcal Conjugate-13 03/16/2016  . Pneumococcal Polysaccharide-23 03/19/2017  . Zoster Recombinat (Shingrix) 07/17/2016, 10/31/2017    Health Maintenance  Topic Date Due  . MAMMOGRAM  12/29/2018  . TETANUS/TDAP  04/19/1959  . INFLUENZA VACCINE  10/04/2018  . DEXA SCAN  Completed  . PNA vac Low Risk Adult  Completed       Assessment  This is a routine wellness examination for [name].  Health Maintenance: Due or Overdue Health Maintenance Due  Topic Date Due      . Samul Dada  04/19/1959   Recommended at next in person visit with Particia Nearing, PA   [name] does not need a referral for Community Assistance: Care Management:   no Social Work:    no Prescription Assistance:  no Nutrition/Diabetes Education:  no   Plan:  Biomedical engineer             . Have 3 meals a day     Increase fruits and vegetables Increase water intake           Personalized Health Maintenance & Screening Recommendations  Td vaccine Advanced directives: has an advanced directive - a  copy has been provided, has an advanced directive - a copy HAS NOT been provided.  Lung Cancer Screening Recommended: no (Low Dose CT Chest recommended if Age 55-80 years, 30 pack-year currently smoking OR have quit w/in past 15 years) Hepatitis C Screening recommended: not applicable   Advanced Directives: Written information was not prepared per patient's request.  Referrals & Orders N/A  Follow-up Plan . Follow-up with Terald Sleeper, PA-C as planned . At your convenience, please bring a copy of your Advance Directives (Healthcare Power of Attorney and Living  Will) to our office to be filed in your medical record.    I have personally reviewed and noted the following in the patient's chart:   . Medical and social history . Use of alcohol, tobacco or illicit drugs  . Current medications and supplements . Functional ability and status . Nutritional status . Physical activity . Advanced directives . List of other physicians . Hospitalizations, surgeries, and ER visits in previous 12 months . Vitals . Screenings to include cognitive, depression, and falls . Referrals and appointments  In addition, I have reviewed and discussed with [name] certain preventive protocols, quality metrics, and best practice recommendations. A written personalized care plan for preventive services as well as general preventive health recommendations is available and can be mailed to the patient at her request.      Jonas Goh M  08/12/2018

## 2018-08-13 ENCOUNTER — Encounter: Payer: Self-pay | Admitting: Physician Assistant

## 2018-08-13 DIAGNOSIS — Z510 Encounter for antineoplastic radiation therapy: Secondary | ICD-10-CM | POA: Diagnosis not present

## 2018-08-13 DIAGNOSIS — F419 Anxiety disorder, unspecified: Secondary | ICD-10-CM | POA: Insufficient documentation

## 2018-08-13 DIAGNOSIS — C50412 Malignant neoplasm of upper-outer quadrant of left female breast: Secondary | ICD-10-CM | POA: Diagnosis not present

## 2018-08-14 ENCOUNTER — Other Ambulatory Visit: Payer: Self-pay | Admitting: Acute Care

## 2018-08-14 DIAGNOSIS — Z510 Encounter for antineoplastic radiation therapy: Secondary | ICD-10-CM | POA: Diagnosis not present

## 2018-08-14 DIAGNOSIS — C50412 Malignant neoplasm of upper-outer quadrant of left female breast: Secondary | ICD-10-CM | POA: Diagnosis not present

## 2018-08-14 NOTE — Progress Notes (Signed)
MEDICARE ANNUAL WELLNESS VISIT  08/14/2018  Telephone Visit Disclaimer This Medicare AWV was conducted by telephone due to national recommendations for restrictions regarding the COVID-19 Pandemic (e.g. social distancing).  I verified, using two identifiers, that I am speaking with [place] or their authorized healthcare agent. I discussed the limitations, risks, security, and privacy concerns of performing an evaluation and management service by telephone and the potential availability of an in-person appointment in the future. The patient expressed understanding and agreed to proceed.   Subjective:  Dorena Dorfman is a 78 y.o. female patient of Terald Sleeper, PA-C who had a Medicare Annual Wellness Visit today via telephone. 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 She has 6 children, 1 son is deceased, 42 grandchildren, and 34 great grandchildren. She reports that she is socially active and does interact with friends/family daily.  She is minimally physically active and enjoys playing cards and spending time with family.  She is active in her church.  [place] is currently receiving daily radiation treatments for breast cancer.  She states that she and her husband will be moving to [facility] in the next couple of months to live with their son so that he can help take care of her husband who has Parkinson's.  Patient Care Team: Theodoro Clock as PCP - General (Physician Assistant) [name], MD as Consulting Physician (Orthopedic Surgery) Wilford Corner, MD as Consulting Physician (Gastroenterology)  Advanced Directives 08/12/2018 03/06/2017 07/24/2016 11/14/2015 10/07/2015  Does Patient Have a Medical Advance Directive? Yes Yes Yes Yes Yes  Type of Paramedic of [place];Living will - Living will;Healthcare Power of [place];Out of facility DNR (pink MOST or yellow form) - Living will;Healthcare Power of  Attorney  Does patient want to make changes to medical advance directive? No - Patient declined - No - Patient declined - No - Patient declined  Copy of [place] in Chart? No - copy requested - No - copy requested - No - copy requested    Hospital Utilization Over the Past 12 Months: # of hospitalizations or ER visits: 2 ER visits- for a fall and post operative pain # of surgeries: 1- Left breast cancer surgery  Review of Systems    Patient reports that her overall health is worse compared to last year due to breast cancer and treatments.    Review of Systems:  Musculoskeletal - generalized joint pain  All other systems negative.  Pain Assessment Pain Score: 8      Current Medications & Allergies (verified) Allergies as of 08/12/2018      Reactions   Acetaminophen Other (See Comments)   Tylenol with codeine only - GI UPSET   Amoxicillin Rash   Ampicillin Rash   Codeine Phosphate Nausea Only      Medication List       Accurate as of August 12, 2018 11:59 PM. If you have any questions, ask your nurse or doctor.        STOP taking these medications   azithromycin 250 MG tablet Commonly known as: Zithromax Z-Pak Stopped by: Terald Sleeper, PA-C   HYDROcodone-acetaminophen 5-325 MG tablet Commonly known as: NORCO/VICODIN Replaced by: HYDROcodone-acetaminophen 7.5-325 MG tablet You also have another medication with the same name that you need to continue taking as instructed. Stopped by: Terald Sleeper, PA-C     TAKE these medications   albuterol 108 (90 Base) MCG/ACT inhaler Commonly known  as: VENTOLIN HFA Inhale 2 puffs into the lungs every 6 (six) hours as needed for wheezing or shortness of breath.   ALPRAZolam 0.5 MG tablet Commonly known as: Xanax Take 1 tablet (0.5 mg total) by mouth 2 (two) times daily as needed for anxiety.   aspirin EC 81 MG tablet Take 81 mg by mouth daily.   budesonide-formoterol 160-4.5 MCG/ACT inhaler Commonly  known as: SYMBICORT Inhale 2 puffs into the lungs 2 (two) times daily.   citalopram 40 MG tablet Commonly known as: CELEXA Take 1 tablet (40 mg total) by mouth daily.   donepezil 10 MG tablet Commonly known as: ARICEPT Take 1 tablet (10 mg total) by mouth at bedtime.   famotidine 10 MG tablet Commonly known as: PEPCID Take 10 mg by mouth 2 (two) times daily.   furosemide 20 MG tablet Commonly known as: LASIX Take 20 mg by mouth as needed.   HYDROcodone-acetaminophen 5-325 MG tablet Commonly known as: Norco Take 1 tablet by mouth every 6 (six) hours as needed for moderate pain. What changed:   Another medication with the same name was added. Make sure you understand how and when to take each.  Another medication with the same name was removed. Continue taking this medication, and follow the directions you see here. Changed by: Terald Sleeper, PA-C   HYDROcodone-acetaminophen 5-325 MG tablet Commonly known as: Norco Take 1 tablet by mouth every 6 (six) hours as needed for moderate pain. What changed:   Another medication with the same name was added. Make sure you understand how and when to take each.  Another medication with the same name was removed. Continue taking this medication, and follow the directions you see here. Changed by: Terald Sleeper, PA-C   HYDROcodone-acetaminophen 7.5-325 MG tablet Commonly known as: NORCO Take 1 tablet by mouth every 12 (twelve) hours as needed for moderate pain (bid for pain). What changed: You were already taking a medication with the same name, and this prescription was added. Make sure you understand how and when to take each. Replaces: HYDROcodone-acetaminophen 5-325 MG tablet Changed by: Terald Sleeper, PA-C   HYDROcodone-acetaminophen 7.5-325 MG tablet Commonly known as: NORCO Take 1 tablet by mouth every 12 (twelve) hours as needed for moderate pain. What changed: You were already taking a medication with the same name, and  this prescription was added. Make sure you understand how and when to take each. Changed by: Terald Sleeper, PA-C   HYDROcodone-acetaminophen 7.5-325 MG tablet Commonly known as: NORCO Take 1 tablet by mouth every 12 (twelve) hours as needed for moderate pain. What changed: You were already taking a medication with the same name, and this prescription was added. Make sure you understand how and when to take each. Changed by: Terald Sleeper, PA-C   ibuprofen 800 MG tablet Commonly known as: ADVIL TAKE  (1)  TABLET  THREE TIMES DAILY.   omeprazole 20 MG capsule Commonly known as: PRILOSEC TAKE (1) CAPSULE DAILY   pravastatin 20 MG tablet Commonly known as: PRAVACHOL Take 20 mg by mouth daily.   traZODone 100 MG tablet Commonly known as: DESYREL Take 2-3 tablets (200-300 mg total) by mouth at bedtime.   valACYclovir 1000 MG tablet Commonly known as: VALTREX Take 1 tablet (1,000 mg total) by mouth 2 (two) times daily.       History (reviewed): Past Medical History:  Diagnosis Date  . Anxiety   . Degeneration of cervical intervertebral disc   . Generalized  osteoarthrosis, involving multiple sites   . Radiculopathy, cervical   . Rosacea keratitis   . Varicose veins    Past Surgical History:  Procedure Laterality Date  . APPENDECTOMY    . BACK SURGERY  1997  . BREAST SURGERY     implants , removed later  . CHOLECYSTECTOMY    . KNEE ARTHROPLASTY Right    07  . SHOULDER ARTHROSCOPY DISTAL CLAVICLE EXCISION AND OPEN ROTATOR CUFF REPAIR Left   . TONSILLECTOMY    . TOTAL KNEE ARTHROPLASTY Left 10/17/2015   Procedure: TOTAL KNEE ARTHROPLASTY;  Surgeon: [name], MD;  Location: [facility];  Service: Orthopedics;  Laterality: Left;   Family History  Problem Relation Age of Onset  . Heart disease Mother   . Early death Mother   . Glaucoma Mother   . Heart disease Father   . Cancer Sister   . Glaucoma Brother   . Early death Sister   . Glaucoma Brother   . COPD Sister   .  Glaucoma Sister   . Seizures Son   . Alcohol abuse Son   . Drug abuse Son    Social History   Socioeconomic History  . Marital status: Married    Spouse name: Not on file  . Number of children: 6  . Years of education: 41  . Highest education level: Associate degree: occupational, Hotel manager, or vocational program  Occupational History  . Occupation: retired    Comment: Marine scientist   Social Needs  . Financial resource strain: Not hard at all  . Food insecurity    Worry: Never true    Inability: Never true  . Transportation needs    Medical: No    Non-medical: No  Tobacco Use  . Smoking status: Never Smoker  . Smokeless tobacco: Never Used  Substance and Sexual Activity  . Alcohol use: Yes    Comment: occ  . Drug use: No  . Sexual activity: Not Currently  Lifestyle  . Physical activity    Days per week: 0 days    Minutes per session: 0 min  . Stress: Only a little  Relationships  . Social connections    Talks on phone: More than three times a week    Gets together: More than three times a week    Attends religious service: More than 4 times per year    Active member of club or organization: Yes    Attends meetings of clubs or organizations: More than 4 times per year    Relationship status: Married  Other Topics Concern  . Not on file  Social History Narrative  . Not on file    Activities of Daily Living In your present state of health, do you have any difficulty performing the following activities: 08/12/2018  Hearing? Y  Comment Slight decrease in hearing, does not want hearing evaluation at this time  Vision? N  Difficulty concentrating or making decisions? Y  Walking or climbing stairs? N  Dressing or bathing? N  Doing errands, shopping? N  Preparing Food and eating ? N  Using the Toilet? N  In the past six months, have you accidently leaked urine? Y  Comment Urinary frequency  Do you have problems with loss of bowel control? Y  Comment Due to chemotherapy   Managing your Medications? N  Managing your Finances? N  Housekeeping or managing your Housekeeping? N  Some recent data might be hidden        Exercise Current Exercise Habits:  The patient does not participate in regular exercise at present, Exercise limited by: Other - see comments;orthopedic condition(s)(Cancer treatments)  Diet Patient reports consuming 2 meals a day and 1 snack(s) a day Patient reports that her primary diet is: Regular Patient reports that she does have regular access to food.   Depression Screen PHQ 2/9 Scores 08/12/2018 04/23/2018 09/27/2017 09/16/2017 07/03/2017 06/24/2017 05/03/2017  PHQ - 2 Score 1 2 2  0 0 0 0  PHQ- 9 Score - 6 6 - - - -     Fall Risk Fall Risk  08/12/2018 09/27/2017 09/16/2017 07/03/2017 06/24/2017  Falls in the past year? 1 No No No No  Follow up Falls prevention discussed - - - -     Objective:  Jonna Clark seemed alert and oriented and she participated appropriately during our telephone visit.  Blood Pressure Weight BMI  BP Readings from Last 3 Encounters:  04/23/18 129/76  02/04/18 131/73  02/03/18 120/70   Wt Readings from Last 3 Encounters:  04/23/18 203 lb (92.1 kg)  02/04/18 203 lb 12.8 oz (92.4 kg)  02/03/18 205 lb 9.6 oz (93.3 kg)   BMI Readings from Last 1 Encounters:  04/23/18 41.00 kg/m    *Unable to obtain current vital signs, weight, and BMI due to telephone visit type  Hearing/Vision  . [place] did not seem to have difficulty with hearing/understanding during the telephone conversation . Reports that she has had a formal eye exam by an eye care professional within the past year . Reports that she has not had a formal hearing evaluation within the past year *Unable to fully assess hearing and vision during telephone visit type  Cognitive Function: 6CIT Screen 08/12/2018  What Year? 0 points  What month? 0 points  What time? 0 points  Count back from 20 0 points  Months in reverse 2 points  Repeat phrase 4 points   Total Score 6    Normal Cognitive Function Screening: Yes (Normal:0-7, Significant for Dysfunction: >8)  Immunization & Health Maintenance Record Immunization History  Administered Date(s) Administered  . Influenza, High Dose Seasonal PF 03/19/2017, 02/04/2018  . Pneumococcal Conjugate-13 03/16/2016  . Pneumococcal Polysaccharide-23 03/19/2017  . Zoster Recombinat (Shingrix) 07/17/2016, 10/31/2017    Health Maintenance  Topic Date Due  . MAMMOGRAM  12/29/2018  . TETANUS/TDAP  04/19/1959  . INFLUENZA VACCINE  10/04/2018  . DEXA SCAN  Completed  . PNA vac Low Risk Adult  Completed       Assessment  This is a routine wellness examination for [place].  Health Maintenance: Due or Overdue Health Maintenance Due  Topic Date Due      . Samul Dada  04/19/1959   Recommended at next in person visit with Particia Nearing, PA   Jonna Clark does not need a referral for Community Assistance: Care Management:   no Social Work:    no Prescription Assistance:  no Nutrition/Diabetes Education:  no   Plan:  Biomedical engineer             . Have 3 meals a day     Increase fruits and vegetables Increase water intake           Personalized Health Maintenance & Screening Recommendations  Td vaccine Advanced directives: has an advanced directive - a copy has been provided, has an advanced directive - a copy HAS NOT been provided.  Lung Cancer Screening Recommended: no (Low Dose CT Chest recommended if Age 73-80 years, 30 pack-year currently smoking  OR have quit w/in past 15 years) Hepatitis C Screening recommended: not applicable   Advanced Directives: Written information was not prepared per patient's request.  Referrals & Orders N/A  Follow-up Plan . Follow-up with Terald Sleeper, PA-C as planned . At your convenience, please bring a copy of your Advance Directives (Healthcare Power of Attorney and Living Will) to our office to be filed in your  medical record.    I have personally reviewed and noted the following in the patient's chart:   . Medical and social history . Use of alcohol, tobacco or illicit drugs  . Current medications and supplements . Functional ability and status . Nutritional status . Physical activity . Advanced directives . List of other physicians . Hospitalizations, surgeries, and ER visits in previous 12 months . Vitals . Screenings to include cognitive, depression, and falls . Referrals and appointments  In addition, I have reviewed and discussed with [place] certain preventive protocols, quality metrics, and best practice recommendations. A written personalized care plan for preventive services as well as general preventive health recommendations is available and can be mailed to the patient at her request.      Nolberto Hanlon, RN  08/14/2018

## 2018-08-15 DIAGNOSIS — Z510 Encounter for antineoplastic radiation therapy: Secondary | ICD-10-CM | POA: Diagnosis not present

## 2018-08-15 DIAGNOSIS — C50412 Malignant neoplasm of upper-outer quadrant of left female breast: Secondary | ICD-10-CM | POA: Diagnosis not present

## 2018-08-18 DIAGNOSIS — Z510 Encounter for antineoplastic radiation therapy: Secondary | ICD-10-CM | POA: Diagnosis not present

## 2018-08-18 DIAGNOSIS — C50412 Malignant neoplasm of upper-outer quadrant of left female breast: Secondary | ICD-10-CM | POA: Diagnosis not present

## 2018-08-19 DIAGNOSIS — C50412 Malignant neoplasm of upper-outer quadrant of left female breast: Secondary | ICD-10-CM | POA: Diagnosis not present

## 2018-08-19 DIAGNOSIS — Z510 Encounter for antineoplastic radiation therapy: Secondary | ICD-10-CM | POA: Diagnosis not present

## 2018-08-20 DIAGNOSIS — C50412 Malignant neoplasm of upper-outer quadrant of left female breast: Secondary | ICD-10-CM | POA: Diagnosis not present

## 2018-08-20 DIAGNOSIS — Z510 Encounter for antineoplastic radiation therapy: Secondary | ICD-10-CM | POA: Diagnosis not present

## 2018-08-21 DIAGNOSIS — Z510 Encounter for antineoplastic radiation therapy: Secondary | ICD-10-CM | POA: Diagnosis not present

## 2018-08-21 DIAGNOSIS — C50412 Malignant neoplasm of upper-outer quadrant of left female breast: Secondary | ICD-10-CM | POA: Diagnosis not present

## 2018-08-22 DIAGNOSIS — Z510 Encounter for antineoplastic radiation therapy: Secondary | ICD-10-CM | POA: Diagnosis not present

## 2018-08-22 DIAGNOSIS — C50412 Malignant neoplasm of upper-outer quadrant of left female breast: Secondary | ICD-10-CM | POA: Diagnosis not present

## 2018-08-25 DIAGNOSIS — C50412 Malignant neoplasm of upper-outer quadrant of left female breast: Secondary | ICD-10-CM | POA: Diagnosis not present

## 2018-08-25 DIAGNOSIS — Z510 Encounter for antineoplastic radiation therapy: Secondary | ICD-10-CM | POA: Diagnosis not present

## 2018-08-26 DIAGNOSIS — C50412 Malignant neoplasm of upper-outer quadrant of left female breast: Secondary | ICD-10-CM | POA: Diagnosis not present

## 2018-08-26 DIAGNOSIS — Z510 Encounter for antineoplastic radiation therapy: Secondary | ICD-10-CM | POA: Diagnosis not present

## 2018-08-27 DIAGNOSIS — C50412 Malignant neoplasm of upper-outer quadrant of left female breast: Secondary | ICD-10-CM | POA: Diagnosis not present

## 2018-08-27 DIAGNOSIS — Z510 Encounter for antineoplastic radiation therapy: Secondary | ICD-10-CM | POA: Diagnosis not present

## 2018-08-28 DIAGNOSIS — Z510 Encounter for antineoplastic radiation therapy: Secondary | ICD-10-CM | POA: Diagnosis not present

## 2018-08-28 DIAGNOSIS — C50412 Malignant neoplasm of upper-outer quadrant of left female breast: Secondary | ICD-10-CM | POA: Diagnosis not present

## 2018-08-29 DIAGNOSIS — C50412 Malignant neoplasm of upper-outer quadrant of left female breast: Secondary | ICD-10-CM | POA: Diagnosis not present

## 2018-08-29 DIAGNOSIS — Z510 Encounter for antineoplastic radiation therapy: Secondary | ICD-10-CM | POA: Diagnosis not present

## 2018-09-01 DIAGNOSIS — Z510 Encounter for antineoplastic radiation therapy: Secondary | ICD-10-CM | POA: Diagnosis not present

## 2018-09-01 DIAGNOSIS — C50412 Malignant neoplasm of upper-outer quadrant of left female breast: Secondary | ICD-10-CM | POA: Diagnosis not present

## 2018-09-09 DIAGNOSIS — C50412 Malignant neoplasm of upper-outer quadrant of left female breast: Secondary | ICD-10-CM | POA: Diagnosis not present

## 2018-09-09 DIAGNOSIS — Z171 Estrogen receptor negative status [ER-]: Secondary | ICD-10-CM | POA: Diagnosis not present

## 2018-09-09 DIAGNOSIS — I10 Essential (primary) hypertension: Secondary | ICD-10-CM | POA: Diagnosis not present

## 2018-09-09 DIAGNOSIS — Z79899 Other long term (current) drug therapy: Secondary | ICD-10-CM | POA: Diagnosis not present

## 2018-09-09 DIAGNOSIS — E785 Hyperlipidemia, unspecified: Secondary | ICD-10-CM | POA: Diagnosis not present

## 2018-09-10 ENCOUNTER — Other Ambulatory Visit: Payer: Self-pay | Admitting: Physician Assistant

## 2018-09-10 DIAGNOSIS — F339 Major depressive disorder, recurrent, unspecified: Secondary | ICD-10-CM

## 2018-09-15 DIAGNOSIS — Z79899 Other long term (current) drug therapy: Secondary | ICD-10-CM | POA: Diagnosis not present

## 2018-09-15 DIAGNOSIS — T451X5A Adverse effect of antineoplastic and immunosuppressive drugs, initial encounter: Secondary | ICD-10-CM | POA: Diagnosis not present

## 2018-09-15 DIAGNOSIS — Z95828 Presence of other vascular implants and grafts: Secondary | ICD-10-CM | POA: Diagnosis not present

## 2018-09-15 DIAGNOSIS — G620 Drug-induced polyneuropathy: Secondary | ICD-10-CM | POA: Diagnosis not present

## 2018-09-15 DIAGNOSIS — C50412 Malignant neoplasm of upper-outer quadrant of left female breast: Secondary | ICD-10-CM | POA: Diagnosis not present

## 2018-09-15 DIAGNOSIS — Z171 Estrogen receptor negative status [ER-]: Secondary | ICD-10-CM | POA: Diagnosis not present

## 2018-09-15 DIAGNOSIS — E039 Hypothyroidism, unspecified: Secondary | ICD-10-CM | POA: Diagnosis not present

## 2018-09-15 DIAGNOSIS — K6389 Other specified diseases of intestine: Secondary | ICD-10-CM | POA: Diagnosis not present

## 2018-09-15 DIAGNOSIS — E041 Nontoxic single thyroid nodule: Secondary | ICD-10-CM | POA: Diagnosis not present

## 2018-10-08 ENCOUNTER — Other Ambulatory Visit: Payer: Self-pay | Admitting: Acute Care

## 2018-10-13 ENCOUNTER — Telehealth: Payer: Self-pay | Admitting: Physician Assistant

## 2018-10-13 NOTE — Telephone Encounter (Signed)
Diagnosis code given.

## 2018-10-22 ENCOUNTER — Ambulatory Visit: Payer: Medicare Other | Admitting: Physician Assistant

## 2018-10-27 DIAGNOSIS — R928 Other abnormal and inconclusive findings on diagnostic imaging of breast: Secondary | ICD-10-CM | POA: Diagnosis not present

## 2018-10-27 DIAGNOSIS — Z171 Estrogen receptor negative status [ER-]: Secondary | ICD-10-CM | POA: Diagnosis not present

## 2018-10-27 DIAGNOSIS — C50412 Malignant neoplasm of upper-outer quadrant of left female breast: Secondary | ICD-10-CM | POA: Diagnosis not present

## 2018-11-04 ENCOUNTER — Encounter: Payer: Self-pay | Admitting: Physician Assistant

## 2018-11-06 ENCOUNTER — Other Ambulatory Visit: Payer: Self-pay | Admitting: Acute Care

## 2018-11-19 ENCOUNTER — Other Ambulatory Visit: Payer: Self-pay | Admitting: Physician Assistant

## 2018-11-19 ENCOUNTER — Other Ambulatory Visit: Payer: Self-pay | Admitting: Acute Care

## 2018-11-26 ENCOUNTER — Ambulatory Visit (INDEPENDENT_AMBULATORY_CARE_PROVIDER_SITE_OTHER): Payer: Medicare Other | Admitting: Physician Assistant

## 2018-11-26 ENCOUNTER — Telehealth: Payer: Self-pay | Admitting: Physician Assistant

## 2018-11-26 ENCOUNTER — Other Ambulatory Visit: Payer: Self-pay | Admitting: Physician Assistant

## 2018-11-26 DIAGNOSIS — G8929 Other chronic pain: Secondary | ICD-10-CM

## 2018-11-26 DIAGNOSIS — M1711 Unilateral primary osteoarthritis, right knee: Secondary | ICD-10-CM

## 2018-11-26 DIAGNOSIS — M25572 Pain in left ankle and joints of left foot: Secondary | ICD-10-CM

## 2018-11-26 DIAGNOSIS — K219 Gastro-esophageal reflux disease without esophagitis: Secondary | ICD-10-CM

## 2018-11-26 DIAGNOSIS — M1712 Unilateral primary osteoarthritis, left knee: Secondary | ICD-10-CM

## 2018-11-26 MED ORDER — HYDROCODONE-ACETAMINOPHEN 7.5-325 MG PO TABS: 1.0000 | ORAL_TABLET | Freq: Two times a day (BID) | ORAL | 0 refills | Status: AC | PRN

## 2018-11-26 MED ORDER — MELOXICAM 7.5 MG PO TABS: 7.5000 mg | ORAL_TABLET | Freq: Every day | ORAL | 0 refills | Status: DC

## 2018-11-26 MED ORDER — OMEPRAZOLE 20 MG PO CPDR: DELAYED_RELEASE_CAPSULE | ORAL | 3 refills | Status: AC

## 2018-11-26 MED ORDER — HYDROCODONE-ACETAMINOPHEN 7.5-325 MG PO TABS
1.0000 | ORAL_TABLET | Freq: Two times a day (BID) | ORAL | 0 refills | Status: AC | PRN
Start: 2018-11-26 — End: ?

## 2018-11-26 NOTE — Telephone Encounter (Signed)
Patient aware.

## 2018-11-26 NOTE — Telephone Encounter (Signed)
Meloxicam 7.5 mg is sent to her pharmacy 1 daily

## 2018-11-27 ENCOUNTER — Encounter: Payer: Self-pay | Admitting: Physician Assistant

## 2018-11-27 NOTE — Progress Notes (Signed)
Telephone visit  Subjective: CC: Recheck on chronic medical conditions which do include GERD, osteoarthritis in many joints, especially the knees, also chronic pain of the left ankle worsening PCP: Terald Sleeper, PA-C [id number] [name] is a 78 y.o. female calls for telephone consult today. Patient provides verbal consent for consult held via phone.  Patient is identified with 2 separate identifiers.  At this time the entire area is on COVID-19 social distancing and stay home orders are in place.  Patient is of higher risk and therefore we are performing this by a virtual method.  Location of patient: Home Location of provider: HOME Others present for call: No  Patient is having periodic recheck on her chronic medical conditions which do include multiple arthritic joints.  Her knees bother her the most.  But she also has worsening of her left ankle pain.  She has had it evaluated in the past.  However it has gotten much worse in recent weeks.  She is having to help her husband who has Alzheimer's.  He had a very weak and abnormal gait to begin with but is also being treated for an ulcer on his foot.  So she is having to help him move around a lot.  She knows that this is part of the reason things are being aggravated.  She also does have GERD which is fairly well controlled.  She does need a refill on her medication.  I encouraged her to use NSAIDs on just an as-needed basis.  So that it does not worsen her GERD symptoms.    ROS: Per HPI  Allergies  Allergen Reactions  . Acetaminophen Other (See Comments)    Tylenol with codeine only - GI UPSET  . Amoxicillin Rash  . Ampicillin Rash  . Codeine Phosphate Nausea Only   Past Medical History:  Diagnosis Date  . Anxiety   . Degeneration of cervical intervertebral disc   . Generalized osteoarthrosis, involving multiple sites   . Radiculopathy, cervical   . Rosacea keratitis   . Varicose veins     Current Outpatient  Medications:  .  albuterol (PROVENTIL HFA;VENTOLIN HFA) 108 (90 Base) MCG/ACT inhaler, Inhale 2 puffs into the lungs every 6 (six) hours as needed for wheezing or shortness of breath., Disp: 1 Inhaler, Rfl: 5 .  ALPRAZolam (XANAX) 0.5 MG tablet, Take 1 tablet (0.5 mg total) by mouth 2 (two) times daily as needed for anxiety., Disp: 60 tablet, Rfl: 5 .  aspirin EC 81 MG tablet, Take 81 mg by mouth daily., Disp: , Rfl:  .  budesonide-formoterol (SYMBICORT) 160-4.5 MCG/ACT inhaler, Inhale 2 puffs into the lungs 2 (two) times daily., Disp: 1 Inhaler, Rfl: 2 .  citalopram (CELEXA) 40 MG tablet, Take 1 tablet (40 mg total) by mouth daily., Disp: 30 tablet, Rfl: 3 .  donepezil (ARICEPT) 10 MG tablet, Take 1 tablet (10 mg total) by mouth at bedtime., Disp: 30 tablet, Rfl: 0 .  famotidine (PEPCID) 10 MG tablet, Take 10 mg by mouth 2 (two) times daily., Disp: , Rfl:  .  furosemide (LASIX) 20 MG tablet, Take 20 mg by mouth as needed., Disp: , Rfl:  .  HYDROcodone-acetaminophen (NORCO) 7.5-325 MG tablet, Take 1 tablet by mouth every 12 (twelve) hours as needed for moderate pain., Disp: 60 tablet, Rfl: 0 .  HYDROcodone-acetaminophen (NORCO) 7.5-325 MG tablet, Take 1 tablet by mouth every 12 (twelve) hours as needed for moderate pain., Disp: 60 tablet, Rfl: 0 .  HYDROcodone-acetaminophen ([place])  7.5-325 MG tablet, Take 1 tablet by mouth every 12 (twelve) hours as needed for moderate pain (bid for pain)., Disp: 60 tablet, Rfl: 0 .  ibuprofen (ADVIL,MOTRIN) 800 MG tablet, TAKE  (1)  TABLET  THREE TIMES DAILY., Disp: 90 tablet, Rfl: 1 .  meloxicam (MOBIC) 7.5 MG tablet, Take 1 tablet (7.5 mg total) by mouth daily. arthritis, Disp: 30 tablet, Rfl: 0 .  omeprazole (PRILOSEC) 20 MG capsule, TAKE (1) CAPSULE DAILY, Disp: 90 capsule, Rfl: 3 .  pravastatin (PRAVACHOL) 20 MG tablet, Take 20 mg by mouth daily., Disp: , Rfl:  .  traZODone (DESYREL) 100 MG tablet, Take 2-3 tablets (200-300 mg total) by mouth at bedtime., Disp:  270 tablet, Rfl: 3 .  valACYclovir (VALTREX) 1000 MG tablet, Take 1 tablet (1,000 mg total) by mouth 2 (two) times daily., Disp: 20 tablet, Rfl: 0  Assessment/ Plan: 78 y.o. female   1. Chronic pain of left ankle - AMB referral to orthopedics  2. Primary osteoarthritis of right knee - HYDROcodone-acetaminophen (NORCO) 7.5-325 MG tablet; Take 1 tablet by mouth every 12 (twelve) hours as needed for moderate pain.  Dispense: 60 tablet; Refill: 0 - HYDROcodone-acetaminophen (NORCO) 7.5-325 MG tablet; Take 1 tablet by mouth every 12 (twelve) hours as needed for moderate pain.  Dispense: 60 tablet; Refill: 0 - HYDROcodone-acetaminophen (NORCO) 7.5-325 MG tablet; Take 1 tablet by mouth every 12 (twelve) hours as needed for moderate pain (bid for pain).  Dispense: 60 tablet; Refill: 0  3. Primary osteoarthritis of left knee - HYDROcodone-acetaminophen (NORCO) 7.5-325 MG tablet; Take 1 tablet by mouth every 12 (twelve) hours as needed for moderate pain.  Dispense: 60 tablet; Refill: 0 - HYDROcodone-acetaminophen (NORCO) 7.5-325 MG tablet; Take 1 tablet by mouth every 12 (twelve) hours as needed for moderate pain.  Dispense: 60 tablet; Refill: 0 - HYDROcodone-acetaminophen (NORCO) 7.5-325 MG tablet; Take 1 tablet by mouth every 12 (twelve) hours as needed for moderate pain (bid for pain).  Dispense: 60 tablet; Refill: 0  4. Gastroesophageal reflux disease without esophagitis - omeprazole (PRILOSEC) 20 MG capsule; TAKE (1) CAPSULE DAILY  Dispense: 90 capsule; Refill: 3   No follow-ups on file.  Continue all other maintenance medications as listed above.  Start time: 10:30 AM End time: 10:44 AM  Meds ordered this encounter  Medications  . omeprazole (PRILOSEC) 20 MG capsule    Sig: TAKE (1) CAPSULE DAILY    Dispense:  90 capsule    Refill:  3    Needs ov for refills    Order Specific Question:   Supervising Provider    Answer:   Janora Norlander [id number]  . HYDROcodone-acetaminophen  (NORCO) 7.5-325 MG tablet    Sig: Take 1 tablet by mouth every 12 (twelve) hours as needed for moderate pain.    Dispense:  60 tablet    Refill:  0    May fill today    Order Specific Question:   Supervising Provider    Answer:   Janora Norlander [id number]  . HYDROcodone-acetaminophen (NORCO) 7.5-325 MG tablet    Sig: Take 1 tablet by mouth every 12 (twelve) hours as needed for moderate pain.    Dispense:  60 tablet    Refill:  0    Fill 12/25/18    Order Specific Question:   Supervising Provider    Answer:   Janora Norlander [id number]  . HYDROcodone-acetaminophen (NORCO) 7.5-325 MG tablet    Sig: Take 1 tablet by mouth every 12 (twelve) hours  as needed for moderate pain (bid for pain).    Dispense:  60 tablet    Refill:  0    Fill 01/24/19    Order Specific Question:   Supervising Provider    Answer:   Janora Norlander [id number]    Particia Nearing PA-C [place] [phone]

## 2018-12-08 DIAGNOSIS — I890 Lymphedema, not elsewhere classified: Secondary | ICD-10-CM | POA: Diagnosis not present

## 2018-12-08 DIAGNOSIS — M25872 Other specified joint disorders, left ankle and foot: Secondary | ICD-10-CM | POA: Diagnosis not present

## 2018-12-18 ENCOUNTER — Other Ambulatory Visit: Payer: Self-pay | Admitting: Physician Assistant

## 2019-01-01 ENCOUNTER — Other Ambulatory Visit: Payer: Self-pay | Admitting: Physician Assistant

## 2019-01-01 DIAGNOSIS — F339 Major depressive disorder, recurrent, unspecified: Secondary | ICD-10-CM

## 2019-01-06 ENCOUNTER — Other Ambulatory Visit: Payer: Self-pay | Admitting: Physician Assistant

## 2019-01-06 DIAGNOSIS — F339 Major depressive disorder, recurrent, unspecified: Secondary | ICD-10-CM

## 2019-01-07 DIAGNOSIS — Z08 Encounter for follow-up examination after completed treatment for malignant neoplasm: Secondary | ICD-10-CM | POA: Diagnosis not present

## 2019-01-07 DIAGNOSIS — C50412 Malignant neoplasm of upper-outer quadrant of left female breast: Secondary | ICD-10-CM | POA: Diagnosis not present

## 2019-01-07 DIAGNOSIS — Z853 Personal history of malignant neoplasm of breast: Secondary | ICD-10-CM | POA: Diagnosis not present

## 2019-01-07 DIAGNOSIS — K219 Gastro-esophageal reflux disease without esophagitis: Secondary | ICD-10-CM | POA: Diagnosis not present

## 2019-01-14 DIAGNOSIS — K219 Gastro-esophageal reflux disease without esophagitis: Secondary | ICD-10-CM | POA: Diagnosis not present

## 2019-01-14 DIAGNOSIS — Z08 Encounter for follow-up examination after completed treatment for malignant neoplasm: Secondary | ICD-10-CM | POA: Diagnosis not present

## 2019-01-14 DIAGNOSIS — Z853 Personal history of malignant neoplasm of breast: Secondary | ICD-10-CM | POA: Diagnosis not present

## 2019-01-30 ENCOUNTER — Other Ambulatory Visit: Payer: Self-pay | Admitting: Physician Assistant

## 2019-02-01 DIAGNOSIS — J9601 Acute respiratory failure with hypoxia: Secondary | ICD-10-CM | POA: Diagnosis not present

## 2019-02-01 DIAGNOSIS — Z96653 Presence of artificial knee joint, bilateral: Secondary | ICD-10-CM | POA: Diagnosis not present

## 2019-02-01 DIAGNOSIS — Z79899 Other long term (current) drug therapy: Secondary | ICD-10-CM | POA: Diagnosis not present

## 2019-02-01 DIAGNOSIS — R05 Cough: Secondary | ICD-10-CM | POA: Diagnosis not present

## 2019-02-01 DIAGNOSIS — C50412 Malignant neoplasm of upper-outer quadrant of left female breast: Secondary | ICD-10-CM | POA: Diagnosis not present

## 2019-02-01 DIAGNOSIS — R5081 Fever presenting with conditions classified elsewhere: Secondary | ICD-10-CM | POA: Diagnosis not present

## 2019-02-01 DIAGNOSIS — E8770 Fluid overload, unspecified: Secondary | ICD-10-CM | POA: Diagnosis not present

## 2019-02-01 DIAGNOSIS — U071 COVID-19: Secondary | ICD-10-CM | POA: Diagnosis not present

## 2019-02-01 DIAGNOSIS — R0602 Shortness of breath: Secondary | ICD-10-CM | POA: Diagnosis not present

## 2019-02-01 DIAGNOSIS — J1289 Other viral pneumonia: Secondary | ICD-10-CM | POA: Diagnosis not present

## 2019-02-01 DIAGNOSIS — K219 Gastro-esophageal reflux disease without esophagitis: Secondary | ICD-10-CM | POA: Diagnosis not present

## 2019-02-01 DIAGNOSIS — Z885 Allergy status to narcotic agent status: Secondary | ICD-10-CM | POA: Diagnosis not present

## 2019-02-01 DIAGNOSIS — Z171 Estrogen receptor negative status [ER-]: Secondary | ICD-10-CM | POA: Diagnosis not present

## 2019-02-01 DIAGNOSIS — E785 Hyperlipidemia, unspecified: Secondary | ICD-10-CM | POA: Diagnosis not present

## 2019-02-01 DIAGNOSIS — I10 Essential (primary) hypertension: Secondary | ICD-10-CM | POA: Diagnosis not present

## 2019-02-01 DIAGNOSIS — Z853 Personal history of malignant neoplasm of breast: Secondary | ICD-10-CM | POA: Diagnosis not present

## 2019-02-01 DIAGNOSIS — D709 Neutropenia, unspecified: Secondary | ICD-10-CM | POA: Diagnosis not present

## 2019-02-01 DIAGNOSIS — Z9221 Personal history of antineoplastic chemotherapy: Secondary | ICD-10-CM | POA: Diagnosis not present

## 2019-02-01 DIAGNOSIS — Z881 Allergy status to other antibiotic agents status: Secondary | ICD-10-CM | POA: Diagnosis not present

## 2019-02-03 ENCOUNTER — Telehealth: Payer: Self-pay | Admitting: Physician Assistant

## 2019-02-03 NOTE — Chronic Care Management (AMB) (Signed)
°  Chronic Care Management   Outreach Note  02/03/2019 Name: Tanaiyah Christie MRN: [id number] DOB: 28-Jan-1941  Referred by: Terald Sleeper, PA-C Reason for referral : Chronic Care Management (Initial CCM outreach )   An telephone outreach was attempted today.[place] is in the hospital with covid. The patient was referred to the case management team by for assistance with care management and care coordination.   Follow Up Plan: The care management team will reach out to the patient again over the next 14 days.   [place], [place] [address] Direct Dial: [email or web address]  Website: [email or web address]

## 2019-02-09 MED ORDER — FAMOTIDINE 20 MG PO TABS
10.00 | ORAL_TABLET | ORAL | Status: DC
Start: 2019-02-08 — End: 2019-02-09

## 2019-02-09 MED ORDER — Medication
Status: DC
Start: ? — End: 2019-02-09

## 2019-02-09 MED ORDER — ALBUTEROL SULFATE HFA 108 (90 BASE) MCG/ACT IN AERS
2.00 | INHALATION_SPRAY | RESPIRATORY_TRACT | Status: DC
Start: ? — End: 2019-02-09

## 2019-02-09 MED ORDER — ESTROPLUS PO TABS
1.00 | ORAL_TABLET | ORAL | Status: DC
Start: ? — End: 2019-02-09

## 2019-02-09 MED ORDER — ACETAMINOPHEN 325 MG PO TABS
650.00 | ORAL_TABLET | ORAL | Status: DC
Start: ? — End: 2019-02-09

## 2019-02-09 MED ORDER — TRAZODONE HCL 50 MG PO TABS
200.00 | ORAL_TABLET | ORAL | Status: DC
Start: 2019-02-08 — End: 2019-02-09

## 2019-02-09 MED ORDER — GUAIFENESIN 100 MG/5ML PO LIQD
200.00 | ORAL | Status: DC
Start: ? — End: 2019-02-09

## 2019-02-09 MED ORDER — POLYETHYLENE GLYCOL 3350 17 G PO PACK
17.00 | PACK | ORAL | Status: DC
Start: ? — End: 2019-02-09

## 2019-02-09 MED ORDER — BARO-CAT PO
10.00 | ORAL | Status: DC
Start: ? — End: 2019-02-09

## 2019-02-09 MED ORDER — NITROGLYCERIN 0.4 MG SL SUBL
0.40 | SUBLINGUAL_TABLET | SUBLINGUAL | Status: DC
Start: ? — End: 2019-02-09

## 2019-02-09 MED ORDER — LACTOBACILLUS PO
10.00 | ORAL | Status: DC
Start: 2019-02-08 — End: 2019-02-09

## 2019-02-09 MED ORDER — CITALOPRAM HYDROBROMIDE 20 MG PO TABS
40.00 | ORAL_TABLET | ORAL | Status: DC
Start: 2019-02-09 — End: 2019-02-09

## 2019-02-09 MED ORDER — DOCUSATE SODIUM 100 MG PO CAPS
200.00 | ORAL_CAPSULE | ORAL | Status: DC
Start: ? — End: 2019-02-09

## 2019-02-09 MED ORDER — NAPHAZOLINE HCL OP
2.00 | OPHTHALMIC | Status: DC
Start: ? — End: 2019-02-09

## 2019-02-09 MED ORDER — HYDROCODONE-ACETAMINOPHEN 10-250 MG PO TABS
1.00 | ORAL_TABLET | ORAL | Status: DC
Start: ? — End: 2019-02-09

## 2019-02-09 MED ORDER — HYDROCODONE-ACETAMINOPHEN 10-250 MG PO TABS
0.50 | ORAL_TABLET | ORAL | Status: DC
Start: ? — End: 2019-02-09

## 2019-02-09 MED ORDER — MORPHINE SULFATE ER BEADS 90 MG PO CP24
1.00 | ORAL_CAPSULE | ORAL | Status: DC
Start: 2019-02-08 — End: 2019-02-09

## 2019-02-09 MED ORDER — SG DIBROMM 2-12.5 MG/5ML OR ELIX
6.00 | ORAL_SOLUTION | ORAL | Status: DC
Start: 2019-02-09 — End: 2019-02-09

## 2019-02-09 MED ORDER — ENOXAPARIN SODIUM 40 MG/0.4ML ~~LOC~~ SOLN
0.50 | SUBCUTANEOUS | Status: DC
Start: 2019-02-08 — End: 2019-02-09

## 2019-02-10 ENCOUNTER — Other Ambulatory Visit: Payer: Self-pay | Admitting: Physician Assistant

## 2019-02-10 DIAGNOSIS — F419 Anxiety disorder, unspecified: Secondary | ICD-10-CM

## 2019-02-10 DIAGNOSIS — F339 Major depressive disorder, recurrent, unspecified: Secondary | ICD-10-CM

## 2019-02-11 ENCOUNTER — Other Ambulatory Visit: Payer: Self-pay | Admitting: Physician Assistant

## 2019-02-16 NOTE — Chronic Care Management (AMB) (Signed)
Chronic Care Management   Note  02/16/2019 Name: [name] MRN: [id number] DOB: [birth year]  [name] is a 78 y.o. year old female who is a primary care patient of Theodoro Clock. I reached out to [place] by phone today in response to a referral sent by [name] health plan.     [name] was given information about Chronic Care Management services today including:  1. CCM service includes personalized support from designated clinical staff supervised by her physician, including individualized plan of care and coordination with other care providers 2. 24/7 contact phone numbers for assistance for urgent and routine care needs. 3. Service will only be billed when office clinical staff spend 20 minutes or more in a month to coordinate care. 4. Only one practitioner may furnish and bill the service in a calendar month. 5. The patient may stop CCM services at any time (effective at the end of the month) by phone call to the office staff. 6. The patient will be responsible for cost sharing (co-pay) of up to 20% of the service fee (after annual deductible is met).  Patient did not agree to enrollment in care management services and does not wish to consider. States she will be transferring her care to [facility].    Follow up plan: No further follow up needed.   [place], [place] [address] Direct Dial: [email or web address]  Website: [email or web address]

## 2019-03-07 ENCOUNTER — Other Ambulatory Visit: Payer: Self-pay | Admitting: Physician Assistant

## 2019-03-07 DIAGNOSIS — F339 Major depressive disorder, recurrent, unspecified: Secondary | ICD-10-CM

## 2019-03-12 ENCOUNTER — Other Ambulatory Visit: Payer: Self-pay | Admitting: Physician Assistant

## 2019-03-12 DIAGNOSIS — F419 Anxiety disorder, unspecified: Secondary | ICD-10-CM

## 2019-04-27 ENCOUNTER — Other Ambulatory Visit: Payer: Self-pay | Admitting: Physician Assistant

## 2019-04-27 DIAGNOSIS — M1711 Unilateral primary osteoarthritis, right knee: Secondary | ICD-10-CM

## 2019-04-27 DIAGNOSIS — M1712 Unilateral primary osteoarthritis, left knee: Secondary | ICD-10-CM

## 2019-04-28 ENCOUNTER — Other Ambulatory Visit: Payer: Self-pay | Admitting: Physician Assistant

## 2019-10-16 ENCOUNTER — Other Ambulatory Visit: Payer: Self-pay

## 2019-10-16 DIAGNOSIS — K219 Gastro-esophageal reflux disease without esophagitis: Secondary | ICD-10-CM

## 2020-01-15 IMAGING — CT CT CHEST W/O CM
2 of 3 series · 15 of 36 positions shown, 18 images · non-contrast
Comparison: Chest radiographs dated 06/24/2017

CLINICAL DATA: Chronic cough, bilateral lower extremity edema

EXAM:
CT CHEST WITHOUT CONTRAST
TECHNIQUE: Multidetector CT imaging of the chest was performed following the
standard protocol without IV contrast.

[Series 2: thorax · axial · 0.79mm/px · z∈[+1340,+1594]mm · 12 of 151 slices shown, 15 images]
[im 12/151  mediastinal]
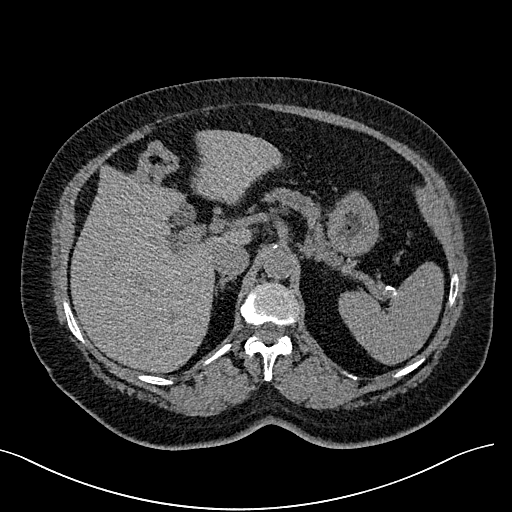
[im 12/151  lung]
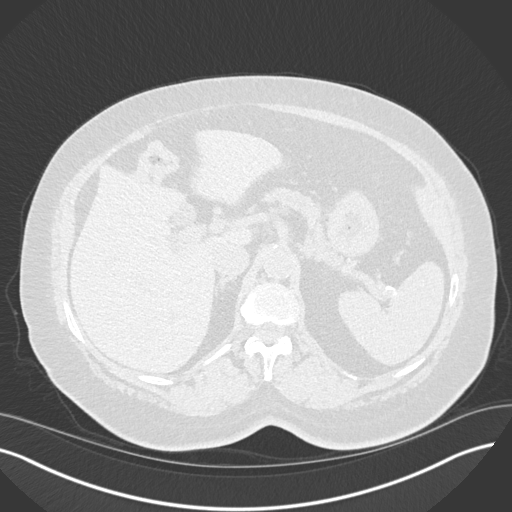
[im 23/151  lung]
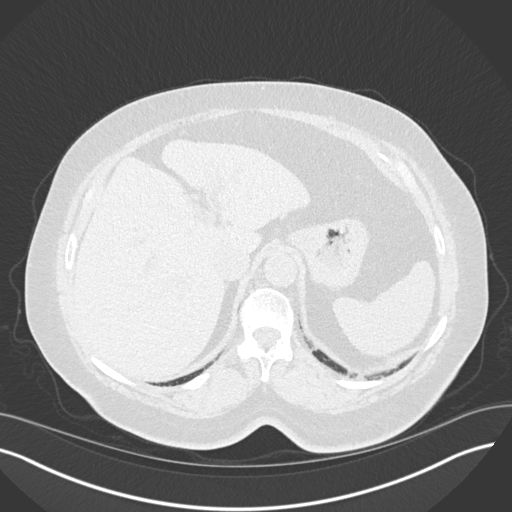
[im 34/151  lung]
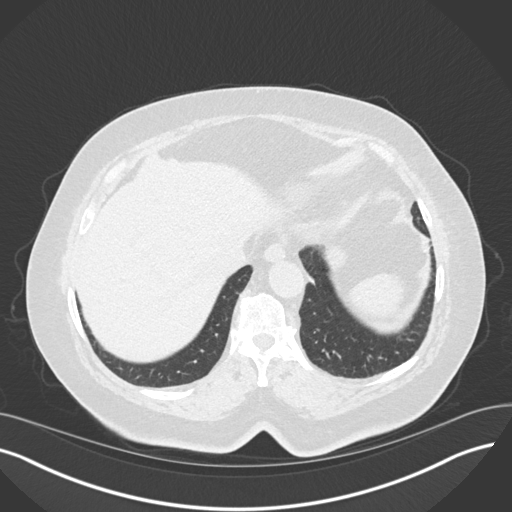
[im 45/151  lung]
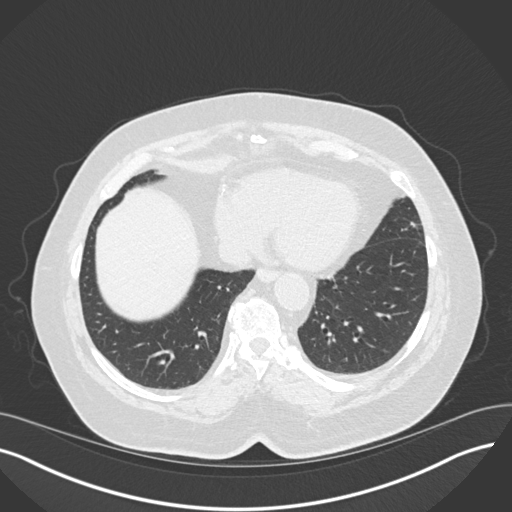
[im 56/151  mediastinal]
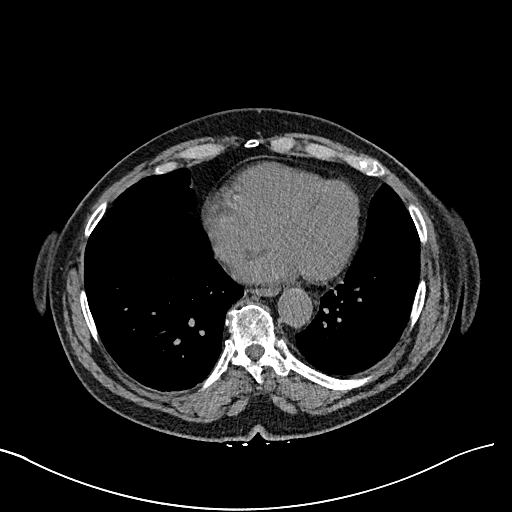
[im 56/151  lung]
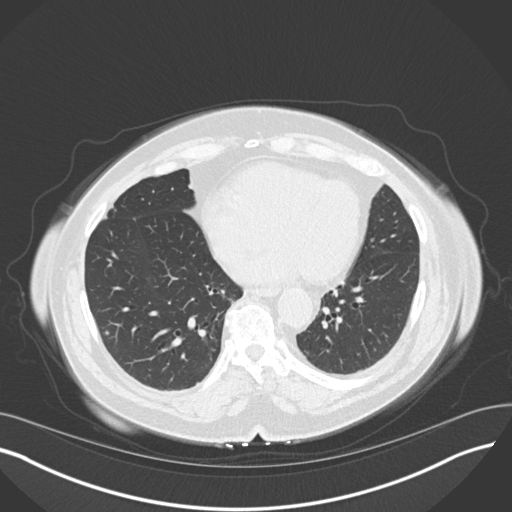
[im 67/151  lung]
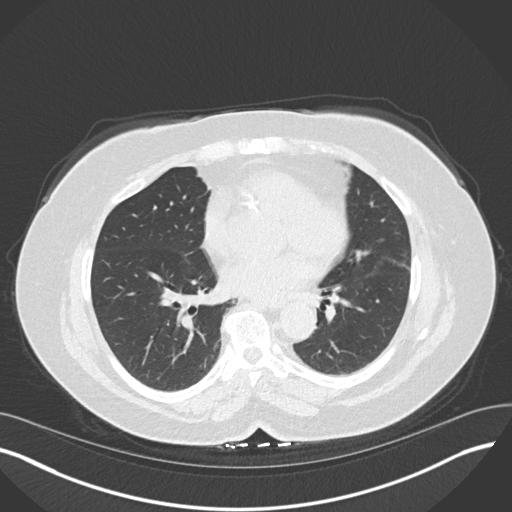
[im 84/151  lung]
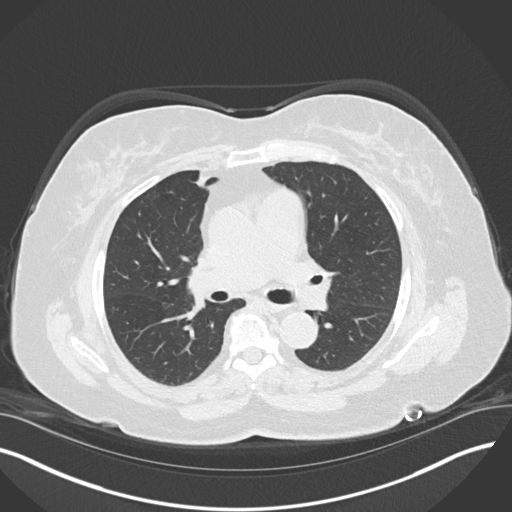
[im 95/151  lung]
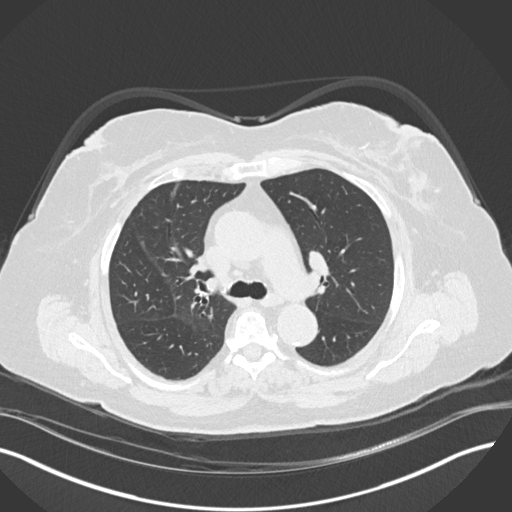
[im 106/151  mediastinal]
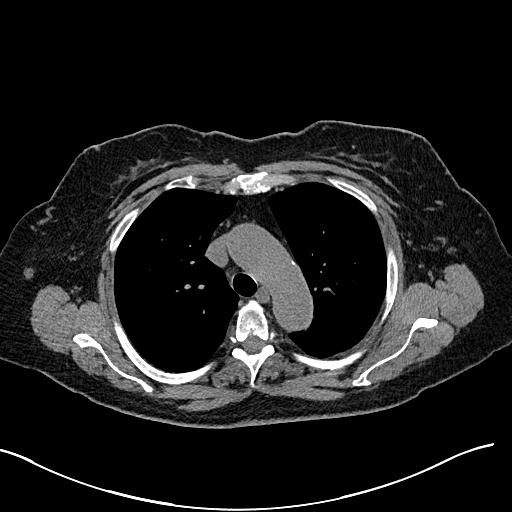
[im 106/151  lung]
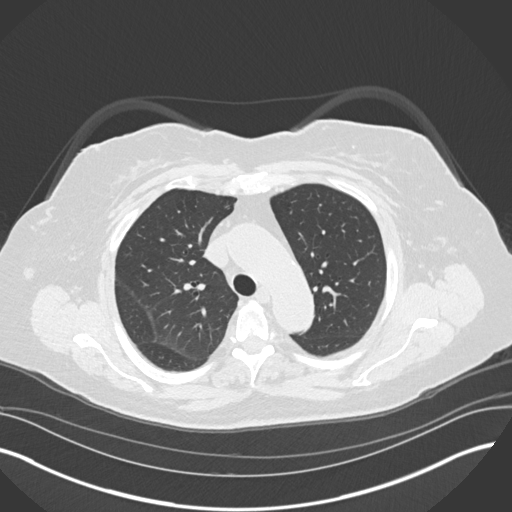
[im 117/151  lung]
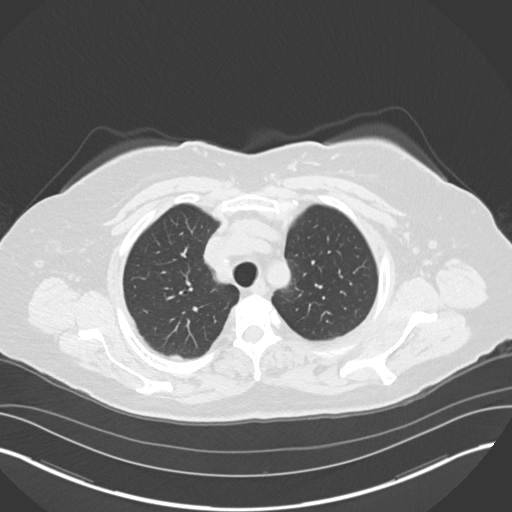
[im 128/151  lung]
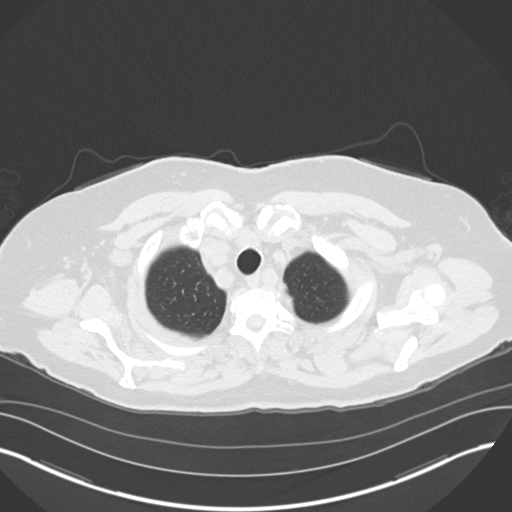
[im 139/151  lung]
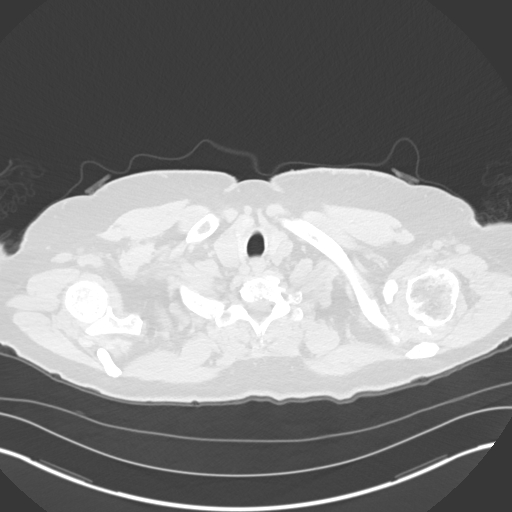

[Series 5: coronal · coronal · 0.59mm/px · 3 of 150 slices shown]
[im 30/150  lung]
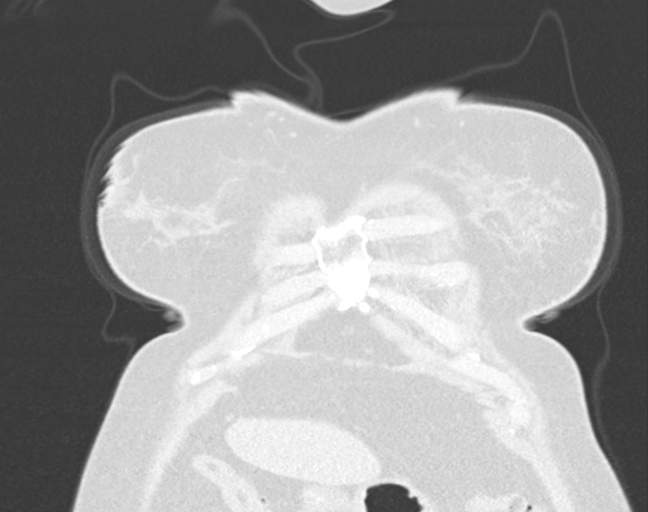
[im 60/150  lung]
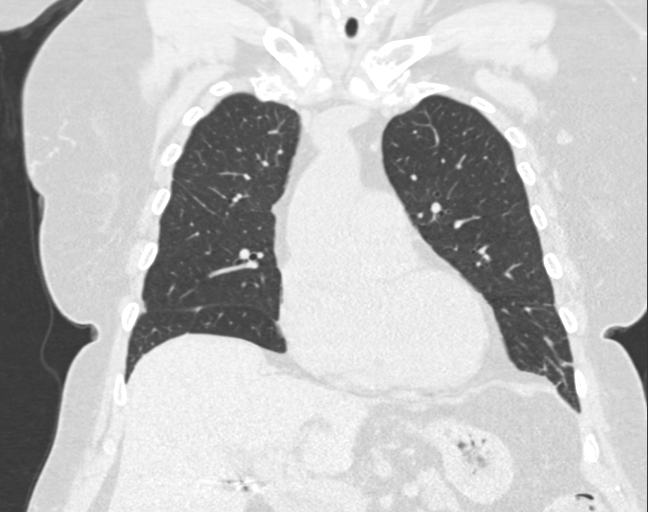
[im 90/150  lung]
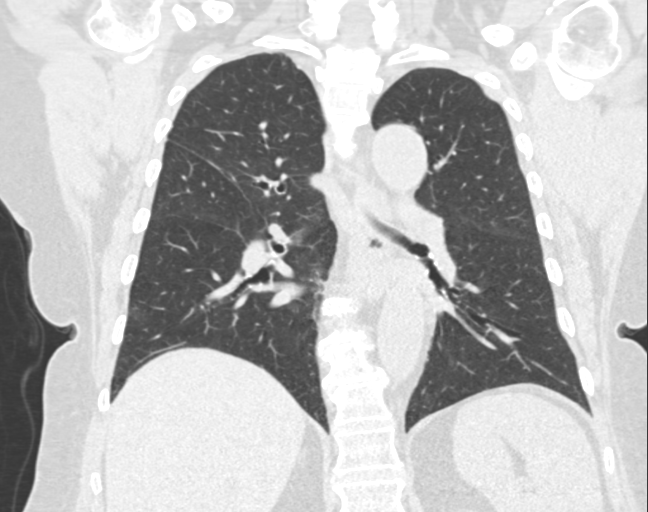

[15 of 36 positions shown; findings below may reference images not displayed]

FINDINGS: Cardiovascular: Heart is normal in size.  No pericardial effusion.

No evidence of thoracic aortic aneurysm. Atherosclerotic
calcifications of the aortic arch.

Mediastinum/Nodes: No suspicious mediastinal lymphadenopathy.

9 mm short axis right axillary node (series 2/image 51).

Visualized thyroid is unremarkable.

Lungs/Pleura: 4 mm subpleural nodule in the posterior left upper
lobe (series 3/image 27).

3 mm subpleural nodule in the right lower lobe (series 3/image 96).

No focal consolidation.

No pleural effusion or pneumothorax.

Upper Abdomen: Visualized upper abdomen is notable for prior
cholecystectomy.

Musculoskeletal: Mild degenerative changes of the visualized
thoracolumbar spine.
IMPRESSION: No evidence of acute cardiopulmonary disease.

Small bilateral pulmonary nodules measuring up to 4 mm. No follow-up
needed if patient is low-risk. Non-contrast chest CT can be
considered in 12 months if patient is high-risk. This recommendation
follows the consensus statement: Guidelines for Management of
Incidental Pulmonary Nodules Detected on CT Images: From the

Aortic Atherosclerosis ([email or web address]-XDT.T).
